# Patient Record
Sex: Female | Born: 1975 | Race: White | Hispanic: No | Marital: Married | Smoking: Former smoker
Health system: Southern US, Community
[De-identification: ages and names within clinical notes are randomized; demographics above are authoritative.]

## PROBLEM LIST (undated history)

## (undated) DIAGNOSIS — K5900 Constipation, unspecified: Secondary | ICD-10-CM

## (undated) DIAGNOSIS — E559 Vitamin D deficiency, unspecified: Secondary | ICD-10-CM

## (undated) DIAGNOSIS — F419 Anxiety disorder, unspecified: Secondary | ICD-10-CM

## (undated) DIAGNOSIS — R609 Edema, unspecified: Secondary | ICD-10-CM

## (undated) DIAGNOSIS — T8859XA Other complications of anesthesia, initial encounter: Secondary | ICD-10-CM

## (undated) DIAGNOSIS — N979 Female infertility, unspecified: Secondary | ICD-10-CM

## (undated) DIAGNOSIS — F909 Attention-deficit hyperactivity disorder, unspecified type: Secondary | ICD-10-CM

## (undated) DIAGNOSIS — Z973 Presence of spectacles and contact lenses: Secondary | ICD-10-CM

## (undated) DIAGNOSIS — E7800 Pure hypercholesterolemia, unspecified: Secondary | ICD-10-CM

## (undated) DIAGNOSIS — R42 Dizziness and giddiness: Secondary | ICD-10-CM

## (undated) DIAGNOSIS — I10 Essential (primary) hypertension: Secondary | ICD-10-CM

## (undated) DIAGNOSIS — D259 Leiomyoma of uterus, unspecified: Secondary | ICD-10-CM

## (undated) DIAGNOSIS — F32A Depression, unspecified: Secondary | ICD-10-CM

## (undated) DIAGNOSIS — Z9889 Other specified postprocedural states: Secondary | ICD-10-CM

## (undated) HISTORY — DX: Constipation, unspecified: K59.00

## (undated) HISTORY — DX: Edema, unspecified: R60.9

## (undated) HISTORY — DX: Essential (primary) hypertension: I10

## (undated) HISTORY — DX: Female infertility, unspecified: N97.9

## (undated) HISTORY — PX: DILATION AND CURETTAGE OF UTERUS: SHX78

## (undated) HISTORY — DX: Pure hypercholesterolemia, unspecified: E78.00

---

## 2010-11-09 HISTORY — PX: DILATION AND CURETTAGE OF UTERUS: SHX78

## 2012-09-01 ENCOUNTER — Other Ambulatory Visit (HOSPITAL_COMMUNITY): Payer: Self-pay | Admitting: Obstetrics & Gynecology

## 2012-09-01 DIAGNOSIS — N979 Female infertility, unspecified: Secondary | ICD-10-CM

## 2012-09-06 ENCOUNTER — Ambulatory Visit (HOSPITAL_COMMUNITY): Payer: Managed Care, Other (non HMO)

## 2014-10-01 ENCOUNTER — Other Ambulatory Visit: Payer: Self-pay | Admitting: Family Medicine

## 2014-10-01 DIAGNOSIS — R102 Pelvic and perineal pain: Secondary | ICD-10-CM

## 2014-11-14 ENCOUNTER — Other Ambulatory Visit: Payer: Self-pay | Admitting: Family Medicine

## 2014-11-14 DIAGNOSIS — R102 Pelvic and perineal pain: Secondary | ICD-10-CM

## 2016-01-06 ENCOUNTER — Other Ambulatory Visit (HOSPITAL_COMMUNITY): Payer: Self-pay | Admitting: Obstetrics & Gynecology

## 2016-01-06 DIAGNOSIS — N979 Female infertility, unspecified: Secondary | ICD-10-CM

## 2016-01-10 ENCOUNTER — Ambulatory Visit (HOSPITAL_COMMUNITY)
Admission: RE | Admit: 2016-01-10 | Discharge: 2016-01-10 | Disposition: A | Discharge status: Home / Self Care | Payer: Managed Care, Other (non HMO) | Source: Ambulatory Visit | Attending: Obstetrics & Gynecology | Admitting: Obstetrics & Gynecology

## 2016-01-10 DIAGNOSIS — N979 Female infertility, unspecified: Secondary | ICD-10-CM | POA: Insufficient documentation

## 2016-01-10 MED ORDER — IOHEXOL 300 MG/ML  SOLN
30.0000 mL | Freq: Once | INTRAMUSCULAR | Status: AC | PRN
  Administered 2016-01-10: 30 mL

## 2017-07-26 ENCOUNTER — Ambulatory Visit (INDEPENDENT_AMBULATORY_CARE_PROVIDER_SITE_OTHER): Payer: Managed Care, Other (non HMO) | Admitting: Obstetrics & Gynecology

## 2017-07-26 ENCOUNTER — Encounter: Payer: Self-pay | Admitting: Obstetrics & Gynecology

## 2017-07-26 VITALS — BP 130/80 | Ht 61.0 in | Wt 168.0 lb

## 2017-07-26 DIAGNOSIS — E6609 Other obesity due to excess calories: Secondary | ICD-10-CM

## 2017-07-26 DIAGNOSIS — Z01419 Encounter for gynecological examination (general) (routine) without abnormal findings: Secondary | ICD-10-CM | POA: Diagnosis not present

## 2017-07-26 DIAGNOSIS — N979 Female infertility, unspecified: Secondary | ICD-10-CM | POA: Diagnosis not present

## 2017-07-26 DIAGNOSIS — Z6831 Body mass index (BMI) 31.0-31.9, adult: Secondary | ICD-10-CM

## 2017-07-26 NOTE — Patient Instructions (Signed)
1. Well female exam with routine gynecological exam Normal gyn exam.  Normal pap/HPV HR neg 04/2016.  Will repeat pap next year.  Breasts wnl.  2. Primary female infertility Expectant management desires.  Declines Fertility treatment, but would welcome spontaneous conception.  3. Class 1 obesity due to excess calories without serious comorbidity with body mass index (BMI) of 31.0 to 31.9 in adult Management of weight loss with Dr Stephanie Acre.  Low Calorie/carb diet discussed, [employer] suggested.  Recommend including weight lifting to physical activity program.  Donna Wong, it was a pleasure to see you today!    Health Maintenance, Female Adopting a healthy lifestyle and getting preventive care can go a long way to promote health and wellness. Talk with your health care provider about what schedule of regular examinations is right for you. This is a good chance for you to check in with your provider about disease prevention and staying healthy. In between checkups, there are plenty of things you can do on your own. Experts have done a lot of research about which lifestyle changes and preventive measures are most likely to keep you healthy. Ask your health care provider for more information. Weight and diet Eat a healthy diet  Be sure to include plenty of vegetables, fruits, low-fat dairy products, and lean protein.  Do not eat a lot of foods high in solid fats, added sugars, or salt.  Get regular exercise. This is one of the most important things you can do for your health. ? Most adults should exercise for at least 150 minutes each week. The exercise should increase your heart rate and make you sweat (moderate-intensity exercise). ? Most adults should also do strengthening exercises at least twice a week. This is in addition to the moderate-intensity exercise.  Maintain a healthy weight  Body mass index (BMI) is a measurement that can be used to identify possible weight problems. It  estimates body fat based on height and weight. Your health care provider can help determine your BMI and help you achieve or maintain a healthy weight.  For females 54 years of age and older: ? A BMI below 18.5 is considered underweight. ? A BMI of 18.5 to 24.9 is normal. ? A BMI of 25 to 29.9 is considered overweight. ? A BMI of 30 and above is considered obese.  Watch levels of cholesterol and blood lipids  You should start having your blood tested for lipids and cholesterol at 39 years of age, then have this test every 5 years.  You may need to have your cholesterol levels checked more often if: ? Your lipid or cholesterol levels are high. ? You are older than 39 years of age. ? You are at high risk for heart disease.  Cancer screening Lung Cancer  Lung cancer screening is recommended for adults 33-32 years old who are at high risk for lung cancer because of a history of smoking.  A yearly low-dose CT scan of the lungs is recommended for people who: ? Currently smoke. ? Have quit within the past 15 years. ? Have at least a 30-pack-year history of smoking. A pack year is smoking an average of one pack of cigarettes a day for 1 year.  Yearly screening should continue until it has been 15 years since you quit.  Yearly screening should stop if you develop a health problem that would prevent you from having lung cancer treatment.  Breast Cancer  Practice breast self-awareness. This means understanding how your breasts  normally appear and feel.  It also means doing regular breast self-exams. Let your health care provider know about any changes, no matter how small.  If you are in your 20s or 30s, you should have a clinical breast exam (CBE) by a health care provider every 1-3 years as part of a regular health exam.  If you are 42 or older, have a CBE every year. Also consider having a breast X-ray (mammogram) every year.  If you have a family history of breast cancer, talk to  your health care provider about genetic screening.  If you are at high risk for breast cancer, talk to your health care provider about having an MRI and a mammogram every year.  Breast cancer gene (BRCA) assessment is recommended for women who have family members with BRCA-related cancers. BRCA-related cancers include: ? Breast. ? Ovarian. ? Tubal. ? Peritoneal cancers.  Results of the assessment will determine the need for genetic counseling and BRCA1 and BRCA2 testing.  Cervical Cancer Your health care provider may recommend that you be screened regularly for cancer of the pelvic organs (ovaries, uterus, and vagina). This screening involves a pelvic examination, including checking for microscopic changes to the surface of your cervix (Pap test). You may be encouraged to have this screening done every 3 years, beginning at age 66.  For women ages 13-65, health care providers may recommend pelvic exams and Pap testing every 3 years, or they may recommend the Pap and pelvic exam, combined with testing for human papilloma virus (HPV), every 5 years. Some types of HPV increase your risk of cervical cancer. Testing for HPV may also be done on women of any age with unclear Pap test results.  Other health care providers may not recommend any screening for nonpregnant women who are considered low risk for pelvic cancer and who do not have symptoms. Ask your health care provider if a screening pelvic exam is right for you.  If you have had past treatment for cervical cancer or a condition that could lead to cancer, you need Pap tests and screening for cancer for at least 20 years after your treatment. If Pap tests have been discontinued, your risk factors (such as having a new sexual partner) need to be reassessed to determine if screening should resume. Some women have medical problems that increase the chance of getting cervical cancer. In these cases, your health care provider may recommend more  frequent screening and Pap tests.  Colorectal Cancer  This type of cancer can be detected and often prevented.  Routine colorectal cancer screening usually begins at 39 years of age and continues through 39 years of age.  Your health care provider may recommend screening at an earlier age if you have risk factors for colon cancer.  Your health care provider may also recommend using home test kits to check for hidden blood in the stool.  A small camera at the end of a tube can be used to examine your colon directly (sigmoidoscopy or colonoscopy). This is done to check for the earliest forms of colorectal cancer.  Routine screening usually begins at age 34.  Direct examination of the colon should be repeated every 5-10 years through 39 years of age. However, you may need to be screened more often if early forms of precancerous polyps or small growths are found.  Skin Cancer  Check your skin from head to toe regularly.  Tell your health care provider about any new moles or changes in moles,  especially if there is a change in a mole's shape or color.  Also tell your health care provider if you have a mole that is larger than the size of a pencil eraser.  Always use sunscreen. Apply sunscreen liberally and repeatedly throughout the day.  Protect yourself by wearing long sleeves, pants, a wide-brimmed hat, and sunglasses whenever you are outside.  Heart disease, diabetes, and high blood pressure  High blood pressure causes heart disease and increases the risk of stroke. High blood pressure is more likely to develop in: ? People who have blood pressure in the high end of the normal range (130-139/85-89 mm Hg). ? People who are overweight or obese. ? People who are African American.  If you are 51-64 years of age, have your blood pressure checked every 3-5 years. If you are 38 years of age or older, have your blood pressure checked every year. You should have your blood pressure measured  twice-once when you are at a hospital or clinic, and once when you are not at a hospital or clinic. Record the average of the two measurements. To check your blood pressure when you are not at a hospital or clinic, you can use: ? An automated blood pressure machine at a pharmacy. ? A home blood pressure monitor.  If you are between 39 years and 13 years old, ask your health care provider if you should take aspirin to prevent strokes.  Have regular diabetes screenings. This involves taking a blood sample to check your fasting blood sugar level. ? If you are at a normal weight and have a low risk for diabetes, have this test once every three years after 39 years of age. ? If you are overweight and have a high risk for diabetes, consider being tested at a younger age or more often. Preventing infection Hepatitis B  If you have a higher risk for hepatitis B, you should be screened for this virus. You are considered at high risk for hepatitis B if: ? You were born in a country where hepatitis B is common. Ask your health care provider which countries are considered high risk. ? Your parents were born in a high-risk country, and you have not been immunized against hepatitis B (hepatitis B vaccine). ? You have HIV or AIDS. ? You use needles to inject street drugs. ? You live with someone who has hepatitis B. ? You have had sex with someone who has hepatitis B. ? You get hemodialysis treatment. ? You take certain medicines for conditions, including cancer, organ transplantation, and autoimmune conditions.  Hepatitis C  Blood testing is recommended for: ? Everyone born from [birth year] through 1963. ? Anyone with known risk factors for hepatitis C.  Sexually transmitted infections (STIs)  You should be screened for sexually transmitted infections (STIs) including gonorrhea and chlamydia if: ? You are sexually active and are younger than 39 years of age. ? You are older than 39 years of age and your  health care provider tells you that you are at risk for this type of infection. ? Your sexual activity has changed since you were last screened and you are at an increased risk for chlamydia or gonorrhea. Ask your health care provider if you are at risk.  If you do not have HIV, but are at risk, it may be recommended that you take a prescription medicine daily to prevent HIV infection. This is called pre-exposure prophylaxis (PrEP). You are considered at risk if: ? You are sexually  active and do not regularly use condoms or know the HIV status of your partner(s). ? You take drugs by injection. ? You are sexually active with a partner who has HIV.  Talk with your health care provider about whether you are at high risk of being infected with HIV. If you choose to begin PrEP, you should first be tested for HIV. You should then be tested every 3 months for as long as you are taking PrEP. Pregnancy  If you are premenopausal and you may become pregnant, ask your health care provider about preconception counseling.  If you may become pregnant, take 400 to 800 micrograms (mcg) of folic acid every day.  If you want to prevent pregnancy, talk to your health care provider about birth control (contraception). Osteoporosis and menopause  Osteoporosis is a disease in which the bones lose minerals and strength with aging. This can result in serious bone fractures. Your risk for osteoporosis can be identified using a bone density scan.  If you are 48 years of age or older, or if you are at risk for osteoporosis and fractures, ask your health care provider if you should be screened.  Ask your health care provider whether you should take a calcium or vitamin D supplement to lower your risk for osteoporosis.  Menopause may have certain physical symptoms and risks.  Hormone replacement therapy may reduce some of these symptoms and risks. Talk to your health care provider about whether hormone replacement  therapy is right for you. Follow these instructions at home:  Schedule regular health, dental, and eye exams.  Stay current with your immunizations.  Do not use any tobacco products including cigarettes, chewing tobacco, or electronic cigarettes.  If you are pregnant, do not drink alcohol.  If you are breastfeeding, limit how much and how often you drink alcohol.  Limit alcohol intake to no more than 1 drink per day for nonpregnant women. One drink equals 12 ounces of beer, 5 ounces of wine, or 1 ounces of hard liquor.  Do not use street drugs.  Do not share needles.  Ask your health care provider for help if you need support or information about quitting drugs.  Tell your health care provider if you often feel depressed.  Tell your health care provider if you have ever been abused or do not feel safe at home. This information is not intended to replace advice given to you by your health care provider. Make sure you discuss any questions you have with your health care provider. Document Released: 05/11/2011 Document Revised: 04/02/2016 Document Reviewed: 07/30/2015 Elsevier Interactive Patient Education  [employer].

## 2017-07-26 NOTE — Progress Notes (Signed)
Donna Wong February 23, 1976 [id number]   History:    41 y.o. G1P0A1  Married.  Infertility x >7 yrs  RP:  Established patient presenting for annual gyn exam   HPI:  Menses reg normal every month.  Long standing primary infertility.  Declines Fertility treatment and contraception.  No pelvic pain.  Normal vaginal secretions.  Breasts wnl.  Mictions/BMs wnl.  BMI 31.74.  Seen by Dr Georgana Curio MD this am, weight loss management discussed.  Labs at work.  Past medical history,surgical history, family history and social history were all reviewed and documented in the EPIC chart.  Gynecologic History Patient's last menstrual period was 07/09/2017. Contraception: none Last Pap: 04/2016. Results were: Neg/HPV HR neg Last mammogram: Never.   Obstetric History OB History  No data available     ROS: A ROS was performed and pertinent positives and negatives are included in the history.  GENERAL: No fevers or chills. HEENT: No change in vision, no earache, sore throat or sinus congestion. NECK: No pain or stiffness. CARDIOVASCULAR: No chest pain or pressure. No palpitations. PULMONARY: No shortness of breath, cough or wheeze. GASTROINTESTINAL: No abdominal pain, nausea, vomiting or diarrhea, melena or bright red blood per rectum. GENITOURINARY: No urinary frequency, urgency, hesitancy or dysuria. MUSCULOSKELETAL: No joint or muscle pain, no back pain, no recent trauma. DERMATOLOGIC: No rash, no itching, no lesions. ENDOCRINE: No polyuria, polydipsia, no heat or cold intolerance. No recent change in weight. HEMATOLOGICAL: No anemia or easy bruising or bleeding. NEUROLOGIC: No headache, seizures, numbness, tingling or weakness. PSYCHIATRIC: No depression, no loss of interest in normal activity or change in sleep pattern.     Exam:   Ht  (1.549 m)   Wt 168 lb (76.2 kg)   LMP 07/09/2017 Comment: NO BIRTH CONTROL   BMI 31.74 kg/m   Body mass index is 31.74 kg/m.  General appearance : Well  developed well nourished female. No acute distress HEENT: Eyes: no retinal hemorrhage or exudates,  Neck supple, trachea midline, no carotid bruits, no thyroidmegaly Lungs: Clear to auscultation, no rhonchi or wheezes, or rib retractions  Heart: Regular rate and rhythm, no murmurs or gallops Breast:Examined in sitting and supine position were symmetrical in appearance, no palpable masses or tenderness,  no skin retraction, no nipple inversion, no nipple discharge, no skin discoloration, no axillary or supraclavicular lymphadenopathy Abdomen: no palpable masses or tenderness, no rebound or guarding Extremities: no edema or skin discoloration or tenderness  Pelvic: Vulva normal  Bartholin, Urethra, Skene Glands: Within normal limits             Vagina: No gross lesions or discharge  Cervix: No gross lesions or discharge  Uterus  AV, normal size, shape and consistency, non-tender and mobile  Adnexa  Without masses or tenderness  Anus and perineum  normal     Assessment/Plan:  41 y.o. female for annual exam   1. Well female exam with routine gynecological exam Normal gyn exam.  Normal pap/HPV HR neg 04/2016.  Will repeat pap next year.  Breasts wnl.  2. Primary female infertility Expectant management desires.  Declines Fertility treatment, but would welcome spontaneous conception.  3. Class 1 obesity due to excess calories without serious comorbidity with body mass index (BMI) of 31.0 to 31.9 in adult Management of weight loss with Dr Paulino Rily.  Low Calorie/carb diet discussed, [employer] suggested.  Recommend including weight lifting to physical activity program.  Genia Del MD, 3:55 PM 07/26/2017

## 2018-01-14 ENCOUNTER — Ambulatory Visit: Payer: 59 | Admitting: Psychology

## 2018-01-14 DIAGNOSIS — F331 Major depressive disorder, recurrent, moderate: Secondary | ICD-10-CM | POA: Diagnosis not present

## 2018-01-20 ENCOUNTER — Ambulatory Visit: Payer: 59 | Admitting: Psychology

## 2018-01-20 DIAGNOSIS — F331 Major depressive disorder, recurrent, moderate: Secondary | ICD-10-CM

## 2018-01-26 ENCOUNTER — Ambulatory Visit: Payer: 59 | Admitting: Psychology

## 2018-01-26 DIAGNOSIS — F331 Major depressive disorder, recurrent, moderate: Secondary | ICD-10-CM | POA: Diagnosis not present

## 2018-01-31 ENCOUNTER — Ambulatory Visit: Payer: 59 | Admitting: Psychology

## 2018-01-31 DIAGNOSIS — F331 Major depressive disorder, recurrent, moderate: Secondary | ICD-10-CM | POA: Diagnosis not present

## 2018-02-14 ENCOUNTER — Ambulatory Visit: Payer: 59 | Admitting: Psychology

## 2018-02-21 ENCOUNTER — Ambulatory Visit: Payer: 59 | Admitting: Psychology

## 2018-02-21 DIAGNOSIS — F331 Major depressive disorder, recurrent, moderate: Secondary | ICD-10-CM | POA: Diagnosis not present

## 2018-02-28 ENCOUNTER — Ambulatory Visit: Payer: Self-pay | Admitting: Psychology

## 2018-03-07 ENCOUNTER — Ambulatory Visit: Payer: 59 | Admitting: Psychology

## 2018-03-07 DIAGNOSIS — F331 Major depressive disorder, recurrent, moderate: Secondary | ICD-10-CM

## 2018-03-14 ENCOUNTER — Ambulatory Visit: Payer: 59 | Admitting: Psychology

## 2018-03-21 ENCOUNTER — Ambulatory Visit: Payer: 59 | Admitting: Psychology

## 2018-03-21 DIAGNOSIS — F331 Major depressive disorder, recurrent, moderate: Secondary | ICD-10-CM

## 2018-03-28 ENCOUNTER — Ambulatory Visit: Payer: 59 | Admitting: Psychology

## 2018-03-28 DIAGNOSIS — F331 Major depressive disorder, recurrent, moderate: Secondary | ICD-10-CM | POA: Diagnosis not present

## 2018-04-11 ENCOUNTER — Ambulatory Visit: Payer: Self-pay | Admitting: Psychology

## 2018-04-25 ENCOUNTER — Ambulatory Visit: Payer: 59 | Admitting: Psychology

## 2018-04-25 DIAGNOSIS — F331 Major depressive disorder, recurrent, moderate: Secondary | ICD-10-CM

## 2018-05-02 ENCOUNTER — Ambulatory Visit: Payer: 59 | Admitting: Psychology

## 2018-05-09 ENCOUNTER — Ambulatory Visit: Payer: 59 | Admitting: Psychology

## 2018-05-09 DIAGNOSIS — F331 Major depressive disorder, recurrent, moderate: Secondary | ICD-10-CM

## 2018-05-23 ENCOUNTER — Ambulatory Visit: Payer: 59 | Admitting: Psychology

## 2018-05-30 ENCOUNTER — Ambulatory Visit: Payer: 59 | Admitting: Psychology

## 2018-06-06 ENCOUNTER — Ambulatory Visit (INDEPENDENT_AMBULATORY_CARE_PROVIDER_SITE_OTHER): Payer: 59 | Admitting: Psychology

## 2018-06-06 DIAGNOSIS — F331 Major depressive disorder, recurrent, moderate: Secondary | ICD-10-CM

## 2018-06-13 ENCOUNTER — Ambulatory Visit: Payer: 59 | Admitting: Psychology

## 2018-06-20 ENCOUNTER — Ambulatory Visit: Payer: 59 | Admitting: Psychology

## 2018-06-27 ENCOUNTER — Ambulatory Visit: Payer: 59 | Admitting: Psychology

## 2018-06-27 DIAGNOSIS — F331 Major depressive disorder, recurrent, moderate: Secondary | ICD-10-CM

## 2018-07-04 ENCOUNTER — Ambulatory Visit: Payer: 59 | Admitting: Psychology

## 2018-07-27 ENCOUNTER — Encounter: Payer: Managed Care, Other (non HMO) | Admitting: Obstetrics & Gynecology

## 2018-10-25 ENCOUNTER — Encounter: Payer: Managed Care, Other (non HMO) | Admitting: Obstetrics & Gynecology

## 2018-11-07 ENCOUNTER — Encounter: Payer: Self-pay | Admitting: Obstetrics & Gynecology

## 2018-11-07 ENCOUNTER — Other Ambulatory Visit: Payer: Self-pay | Admitting: Obstetrics & Gynecology

## 2018-11-07 ENCOUNTER — Ambulatory Visit: Payer: Managed Care, Other (non HMO) | Admitting: Obstetrics & Gynecology

## 2018-11-07 VITALS — BP 126/82 | Ht 61.5 in | Wt 172.0 lb

## 2018-11-07 DIAGNOSIS — Z3009 Encounter for other general counseling and advice on contraception: Secondary | ICD-10-CM

## 2018-11-07 DIAGNOSIS — E6609 Other obesity due to excess calories: Secondary | ICD-10-CM

## 2018-11-07 DIAGNOSIS — Z6831 Body mass index (BMI) 31.0-31.9, adult: Secondary | ICD-10-CM | POA: Diagnosis not present

## 2018-11-07 DIAGNOSIS — Z1231 Encounter for screening mammogram for malignant neoplasm of breast: Secondary | ICD-10-CM

## 2018-11-07 DIAGNOSIS — R87612 Low grade squamous intraepithelial lesion on cytologic smear of cervix (LGSIL): Secondary | ICD-10-CM

## 2018-11-07 DIAGNOSIS — Z01419 Encounter for gynecological examination (general) (routine) without abnormal findings: Secondary | ICD-10-CM | POA: Diagnosis not present

## 2018-11-07 HISTORY — DX: Low grade squamous intraepithelial lesion on cytologic smear of cervix (LGSIL): R87.612

## 2018-11-07 NOTE — Patient Instructions (Signed)
1. Encounter for routine gynecological examination with Papanicolaou smear of cervix Normal gynecologic exam.  Pap reflex done.  Breast exam normal.  Will schedule first screening mammogram at the breast center.  Health labs with family physician Dr. Paulino RilyWolters  2. Encounter for other general counseling or advice on contraception Declines contraception.  3. Class 1 obesity due to excess calories without serious comorbidity with body mass index (BMI) of 31.0 to 31.9 in adult Decreased caloric intake and intermittent fasting discussed with patient.  Counseling done.  Planning to increase fitness activities.  Recommend aerobic activities 5 times a week and weightlifting every 2 days.  Other orders - amoxicillin (AMOXIL) 875 MG tablet; Take 875 mg by mouth 2 (two) times daily.  Donna Wong, it was a pleasure seeing you today!  I will inform you of your results as soon as they are available.

## 2018-11-07 NOTE — Progress Notes (Signed)
Donna Wong 03/14/1976 [id number]   History:    42 y.o. 641P0A1 Married  RP:  Established patient presenting for annual gyn exam   HPI: Menstrual periods every month with normal flow.  No breakthrough bleeding.  No pelvic pain.  No pain with intercourse.  Urine and bowel movements normal.  Body mass index 31.97.  Gained 4 pounds since last year.  Planning to restart fitness activities and decrease calories.  Health labs with Fam MD Dr. Paulino RilyWolters.  Past medical history,surgical history, family history and social history were all reviewed and documented in the EPIC chart.  Gynecologic History Patient's last menstrual period was 11/02/2018. Contraception: Declined, h/o primary infertility Last Pap: 2017. Results were: Normal Last mammogram: Will schedule at the [facility] Bone Density: Never Colonoscopy: Never  Obstetric History OB History  Gravida Para Term Preterm AB Living  1 0     1 0  SAB TAB Ectopic Multiple Live Births  1            # Outcome Date GA Lbr Len/2nd Weight Sex Delivery Anes PTL Lv  1 SAB              ROS: A ROS was performed and pertinent positives and negatives are included in the history.  GENERAL: No fevers or chills. HEENT: No change in vision, no earache, sore throat or sinus congestion. NECK: No pain or stiffness. CARDIOVASCULAR: No chest pain or pressure. No palpitations. PULMONARY: No shortness of breath, cough or wheeze. GASTROINTESTINAL: No abdominal pain, nausea, vomiting or diarrhea, melena or bright red blood per rectum. GENITOURINARY: No urinary frequency, urgency, hesitancy or dysuria. MUSCULOSKELETAL: No joint or muscle pain, no back pain, no recent trauma. DERMATOLOGIC: No rash, no itching, no lesions. ENDOCRINE: No polyuria, polydipsia, no heat or cold intolerance. No recent change in weight. HEMATOLOGICAL: No anemia or easy bruising or bleeding. NEUROLOGIC: No headache, seizures, numbness, tingling or weakness. PSYCHIATRIC: No depression, no  loss of interest in normal activity or change in sleep pattern.     Exam:   BP 126/82   Ht 5' 1.5" (1.562 m)   Wt 172 lb (78 kg)   LMP 11/02/2018 Comment: no birth control   BMI 31.97 kg/m   Body mass index is 31.97 kg/m.  General appearance : Well developed well nourished female. No acute distress HEENT: Eyes: no retinal hemorrhage or exudates,  Neck supple, trachea midline, no carotid bruits, no thyroidmegaly Lungs: Clear to auscultation, no rhonchi or wheezes, or rib retractions  Heart: Regular rate and rhythm, no murmurs or gallops Breast:Examined in sitting and supine position were symmetrical in appearance, no palpable masses or tenderness,  no skin retraction, no nipple inversion, no nipple discharge, no skin discoloration, no axillary or supraclavicular lymphadenopathy Abdomen: no palpable masses or tenderness, no rebound or guarding Extremities: no edema or skin discoloration or tenderness  Pelvic: Vulva: Normal             Vagina: No gross lesions or discharge  Cervix: No gross lesions or discharge.  Pap reflex done  Uterus  AV, normal size, shape and consistency, non-tender and mobile  Adnexa  Without masses or tenderness  Anus: Normal   Assessment/Plan:  42 y.o. female for annual exam   1. Encounter for routine gynecological examination with Papanicolaou smear of cervix Normal gynecologic exam.  Pap reflex done.  Breast exam normal.  Will schedule first screening mammogram at the breast center.  Health labs with family physician Dr. Paulino RilyWolters  2. Encounter for other general counseling or advice on contraception Declines contraception.  3. Class 1 obesity due to excess calories without serious comorbidity with body mass index (BMI) of 31.0 to 31.9 in adult Decreased caloric intake and intermittent fasting discussed with patient.  Counseling done.  Planning to increase fitness activities.  Recommend aerobic activities 5 times a week and weightlifting every 2  days.  Other orders - amoxicillin (AMOXIL) 875 MG tablet; Take 875 mg by mouth 2 (two) times daily.  Genia DelMarie-Lyne Airianna Kreischer MD, 8:09 AM 11/07/2018

## 2018-11-07 NOTE — Addendum Note (Signed)
Addended by: Berna SpareASTILLO, BLANCA A on: 11/07/2018 09:17 AM   Modules accepted: Orders

## 2018-11-10 ENCOUNTER — Ambulatory Visit
Admission: RE | Admit: 2018-11-10 | Discharge: 2018-11-10 | Disposition: A | Discharge status: Home / Self Care | Payer: 59 | Source: Ambulatory Visit | Attending: Obstetrics & Gynecology | Admitting: Obstetrics & Gynecology

## 2018-11-10 DIAGNOSIS — Z1231 Encounter for screening mammogram for malignant neoplasm of breast: Secondary | ICD-10-CM

## 2018-11-10 LAB — PAP IG W/ RFLX HPV ASCU

## 2018-12-19 ENCOUNTER — Ambulatory Visit: Payer: 59 | Admitting: Psychology

## 2018-12-20 ENCOUNTER — Ambulatory Visit: Payer: Managed Care, Other (non HMO) | Admitting: Obstetrics & Gynecology

## 2019-01-02 ENCOUNTER — Ambulatory Visit: Payer: 59 | Admitting: Psychology

## 2019-01-09 ENCOUNTER — Ambulatory Visit: Payer: 59 | Admitting: Psychology

## 2019-01-18 ENCOUNTER — Encounter (INDEPENDENT_AMBULATORY_CARE_PROVIDER_SITE_OTHER): Payer: Managed Care, Other (non HMO)

## 2019-01-18 ENCOUNTER — Other Ambulatory Visit: Payer: Self-pay

## 2019-01-23 ENCOUNTER — Encounter (INDEPENDENT_AMBULATORY_CARE_PROVIDER_SITE_OTHER): Payer: Self-pay | Admitting: Family Medicine

## 2019-01-23 ENCOUNTER — Ambulatory Visit (INDEPENDENT_AMBULATORY_CARE_PROVIDER_SITE_OTHER): Payer: Managed Care, Other (non HMO) | Admitting: Family Medicine

## 2019-01-23 ENCOUNTER — Other Ambulatory Visit: Payer: Self-pay

## 2019-01-23 VITALS — BP 114/78 | HR 66 | Temp 98.0°F | Ht 62.0 in | Wt 169.0 lb

## 2019-01-23 DIAGNOSIS — Z9189 Other specified personal risk factors, not elsewhere classified: Secondary | ICD-10-CM

## 2019-01-23 DIAGNOSIS — Z0289 Encounter for other administrative examinations: Secondary | ICD-10-CM

## 2019-01-23 DIAGNOSIS — Z6830 Body mass index (BMI) 30.0-30.9, adult: Secondary | ICD-10-CM

## 2019-01-23 DIAGNOSIS — R5383 Other fatigue: Secondary | ICD-10-CM

## 2019-01-23 DIAGNOSIS — E669 Obesity, unspecified: Secondary | ICD-10-CM

## 2019-01-23 DIAGNOSIS — E7849 Other hyperlipidemia: Secondary | ICD-10-CM | POA: Diagnosis not present

## 2019-01-23 DIAGNOSIS — R0602 Shortness of breath: Secondary | ICD-10-CM | POA: Diagnosis not present

## 2019-01-23 DIAGNOSIS — K5909 Other constipation: Secondary | ICD-10-CM | POA: Diagnosis not present

## 2019-01-23 DIAGNOSIS — Z1331 Encounter for screening for depression: Secondary | ICD-10-CM

## 2019-01-23 NOTE — Progress Notes (Signed)
.  Office: [phone]  /  Fax: [phone]   HPI:   Chief Complaint: OBESITY  Donna Wong (MR# [id number]) is a 43 y.o. female who presents on 01/23/2019 for obesity evaluation and treatment. Current BMI is Body mass index is 30.91 kg/m.Marland Kitchen Quatesha has struggled with obesity for years and has been unsuccessful in either losing weight or maintaining long term weight loss. Ethelee attended our information session and states she is currently in the action stage of change and ready to dedicate time achieving and maintaining a healthier weight.   Ketty heard about our clinic from her co-worker.  Nashara states her family eats meals together she struggles with family and or coworkers weight loss sabotage her desired weight loss is 49 lbs she has been heavy most of  her life she started gaining weight about age 57 her heaviest weight ever was 169 lbs she skips meals frequently she is frequently drinking liquids with calories she frequently makes poor food choices she has problems with excessive hunger  she frequently eats larger portions than normal  she struggles with emotional eating    Fatigue Estefany feels her energy is lower than it should be. This has worsened with weight gain and has not worsened recently. Jacelle admits to daytime somnolence and  admits to waking up still tired. Patient is at risk for obstructive sleep apnea. Patent has a history of symptoms of daytime fatigue and morning headache. Patient generally gets 7 hours of sleep per night, and states they generally have nightime awakenings. Snoring is present. Apneic episodes are not present. Epworth Sleepiness Score is 8.  Dyspnea on exertion Kaisha notes increasing shortness of breath with exercising and seems to be worsening over time with weight gain. She notes getting out of breath sooner with activity than she used to. This has not gotten worse recently. EKG-normal sinus rhythm, questionable short PR interval. Revia  denies orthopnea.  Constipation Mariele notes constipation. She is not taking any OTC medications and she denies symptoms currently.   Hyperlipidemia Cheznie has hyperlipidemia and has been trying to improve her cholesterol levels with intensive lifestyle modification including a low saturated fat diet, exercise and weight loss. She reports elevated LDL for many years. She is not on medications and denies any chest pain, claudication or myalgias.  At risk for cardiovascular disease Niharika is at a higher than average risk for cardiovascular disease due to obesity and hyperlipidemia. She currently denies any chest pain.  Depression Screen Kelda's Food and Mood (modified PHQ-9) score was  Depression screen PHQ 2/9 01/23/2019  Decreased Interest 3  Down, Depressed, Hopeless 3  PHQ - 2 Score 6  Altered sleeping 3  Tired, decreased energy 3  Change in appetite 3  Feeling bad or failure about yourself  3  Trouble concentrating 1  Moving slowly or fidgety/restless 0  Suicidal thoughts 0  PHQ-9 Score 19  Difficult doing work/chores Somewhat difficult    ASSESSMENT AND PLAN:  Other fatigue - Plan: EKG 12-Lead, Vitamin B12, CBC With Differential, Comprehensive metabolic panel, Folate, Hemoglobin A1c, Insulin, random, T3, T4, free, TSH, VITAMIN D 25 Hydroxy (Vit-D Deficiency, Fractures)  Shortness of breath on exertion  Other constipation  Other hyperlipidemia - Plan: Lipid Panel With LDL/HDL Ratio  Depression screening  At risk for heart disease  Class 1 obesity with serious comorbidity and body mass index (BMI) of 30.0 to 30.9 in adult, unspecified obesity type  PLAN:  Fatigue Abbott was informed that her fatigue may be related to  obesity, depression or many other causes. Labs will be ordered, and in the meanwhile Gaylene has agreed to work on diet, exercise and weight loss to help with fatigue. Proper sleep hygiene was discussed including the need for 7-8 hours of quality sleep  each night. A sleep study was not ordered based on symptoms and Epworth score.  Dyspnea on exertion Eyvette's shortness of breath appears to be obesity related and exercise induced. She has agreed to work on weight loss and gradually increase exercise to treat her exercise induced shortness of breath. If Basil follows our instructions and loses weight without improvement of her shortness of breath, we will plan to refer to pulmonology. We will monitor this condition regularly. Tuyet agrees to this plan.  Constipation Milanie was informed decrease bowel movement frequency is normal while losing weight, but stools should not be hard or painful. She was advised to increase her H20 intake and work on increasing her fiber intake. High fiber foods were discussed today. We will follow up at next appointment.  Hyperlipidemia Kenlee was informed of the American Heart Association Guidelines emphasizing intensive lifestyle modifications as the first line treatment for hyperlipidemia. We discussed many lifestyle modifications today in depth, and Bayli will continue to work on decreasing saturated fats such as fatty red meat, butter and many fried foods. She will also increase vegetables and lean protein in her diet and continue to work on exercise and weight loss efforts. We will check FLP today. Latina agrees to follow up with our clinic in 2 weeks.  Cardiovascular risk counseling Andreea was given extended (15 minutes) coronary artery disease prevention counseling today. She is 43 y.o. female and has risk factors for heart disease including obesity and hyperlipidemia. We discussed intensive lifestyle modifications today with an emphasis on specific weight loss instructions and strategies. Pt was also informed of the importance of increasing exercise and decreasing saturated fats to help prevent heart disease.  Depression Screen Jhene had a strongly positive depression screening. Depression is commonly  associated with obesity and often results in emotional eating behaviors. We will monitor this closely and work on CBT to help improve the non-hunger eating patterns. Referral to Psychology may be required if no improvement is seen as she continues in our clinic.  Obesity Kyesha is currently in the action stage of change and her goal is to continue with weight loss efforts She has agreed to follow the Category 3 plan Maricsa has been instructed to work up to a goal of 150 minutes of combined cardio and strengthening exercise per week for weight loss and overall health benefits. We discussed the following Behavioral Modification Strategies today: increasing lean protein intake, increasing vegetables and work on meal planning and easy cooking plans, and planning for success  Sayana has agreed to follow up with our clinic in 2 weeks. She was informed of the importance of frequent follow up visits to maximize her success with intensive lifestyle modifications for her multiple health conditions. She was informed we would discuss her lab results at her next visit unless there is a critical issue that needs to be addressed sooner. Teniola agreed to keep her next visit at the agreed upon time to discuss these results.  ALLERGIES: No Known Allergies  MEDICATIONS: Current Outpatient Medications on File Prior to Visit  Medication Sig Dispense Refill  . Biotin 1000 MCG CHEW Chew 1 capsule by mouth daily.    Marland Kitchen ibuprofen (ADVIL,MOTRIN) 200 MG tablet Take 200 mg by mouth every 6 (six)  hours as needed.    . Multiple Vitamin (MULTIVITAMIN) tablet Take 1 tablet by mouth daily.     No current facility-administered medications on file prior to visit.     PAST MEDICAL HISTORY: Past Medical History:  Diagnosis Date  . Constipation   . High blood pressure   . High cholesterol   . Infertility, female   . Swelling     PAST SURGICAL HISTORY: Past Surgical History:  Procedure Laterality Date  . DILATION AND  CURETTAGE OF UTERUS      SOCIAL HISTORY: Social History   Tobacco Use  . Smoking status: Former Smoker    Last attempt to quit: 07/26/2010    Years since quitting: 8.5  . Smokeless tobacco: Never Used  Substance Use Topics  . Alcohol use: Yes    Alcohol/week: 2.0 standard drinks    Types: 2 Cans of beer per week    Comment: 2 drinks a week   . Drug use: No    FAMILY HISTORY: Family History  Problem Relation Age of Onset  . Heart attack Father   . Heart disease Father     ROS: Review of Systems  Constitutional: Positive for malaise/fatigue. Negative for weight loss.       + Trouble sleeping  Eyes:       + Wear glasses or contacts  Respiratory: Positive for shortness of breath (with exertion).   Cardiovascular: Negative for chest pain, orthopnea and claudication.  Gastrointestinal: Positive for constipation.  Musculoskeletal: Negative for myalgias.  Skin:       + Dryness  Neurological: Positive for weakness and headaches.    PHYSICAL EXAM: Blood pressure 114/78, pulse 66, temperature 98 F (36.7 C), temperature source Oral, height  (1.575 m), weight 169 lb (76.7 kg), last menstrual period 01/11/2019, SpO2 99 %. Body mass index is 30.91 kg/m. Physical Exam Vitals signs reviewed.  Constitutional:      Appearance: Normal appearance. She is obese.  HENT:     Head: Normocephalic and atraumatic.     Nose: Nose normal.  Eyes:     General: No scleral icterus.    Extraocular Movements: Extraocular movements intact.  Neck:     Musculoskeletal: Normal range of motion and neck supple.     Comments: No thyromegaly present Cardiovascular:     Rate and Rhythm: Normal rate and regular rhythm.     Pulses: Normal pulses.     Heart sounds: Normal heart sounds.  Pulmonary:     Effort: Pulmonary effort is normal. No respiratory distress.     Breath sounds: Normal breath sounds.  Abdominal:     Palpations: Abdomen is soft.     Tenderness: There is no abdominal  tenderness.     Comments: + Obesity  Musculoskeletal: Normal range of motion.     Right lower leg: No edema.     Left lower leg: No edema.  Skin:    General: Skin is warm and dry.  Neurological:     Mental Status: She is alert and oriented to person, place, and time.     Coordination: Coordination normal.  Psychiatric:        Mood and Affect: Mood normal.        Behavior: Behavior normal.     RECENT LABS AND TESTS: BMET No results found for: NA, K, CL, CO2, GLUCOSE, BUN, CREATININE, CALCIUM, GFRNONAA, GFRAA No results found for: HGBA1C No results found for: INSULIN CBC No results found for: WBC, RBC, HGB, HCT, PLT,  MCV, MCH, MCHC, RDW, LYMPHSABS, MONOABS, EOSABS, BASOSABS Iron/TIBC/Ferritin/ %Sat No results found for: IRON, TIBC, FERRITIN, IRONPCTSAT Lipid Panel  No results found for: CHOL, TRIG, HDL, CHOLHDL, VLDL, LDLCALC, LDLDIRECT Hepatic Function Panel  No results found for: PROT, ALBUMIN, AST, ALT, ALKPHOS, BILITOT, BILIDIR, IBILI No results found for: TSH Vitamin D No recent labs  ECG  shows NSR with a rate of 68 BPM INDIRECT CALORIMETER done today shows a VO2 of 247 and a REE of 1716. Her calculated basal metabolic rate is 2620 thus her basal metabolic rate is better than expected.       OBESITY BEHAVIORAL INTERVENTION VISIT  Today's visit was # 1   Starting weight: 169 lbs Starting date: 01/23/2019 Today's weight :169 lbs Today's date: 01/23/2019 Total lbs lost to date: 0    01/23/2019  Height 5\' 2"  (1.575 m)  Weight 169 lb (76.7 kg)  BMI (Calculated) 30.9  BLOOD PRESSURE - SYSTOLIC 114  BLOOD PRESSURE - DIASTOLIC 78  Waist Measurement  37.5 inches   Body Fat % 36.2 %  Total Body Water (lbs) 73.4 lbs  RMR 1716     ASK: We discussed the diagnosis of obesity with Reynolds Bowl today and Jacklynn agreed to give [place] permission to discuss obesity behavioral modification therapy today.  ASSESS: Lovell has the diagnosis of obesity and her BMI today is  30.9 Latasia is in the action stage of change   ADVISE: Jenny was educated on the multiple health risks of obesity as well as the benefit of weight loss to improve her health. She was advised of the need for long term treatment and the importance of lifestyle modifications to improve her current health and to decrease her risk of future health problems.  AGREE: Multiple dietary modification options and treatment options were discussed and  Zilah agreed to follow the recommendations documented in the above note.  ARRANGE: Sorrel was educated on the importance of frequent visits to treat obesity as outlined per CMS and USPSTF guidelines and agreed to schedule her next follow up appointment today.   I, Burt Knack, am acting as transcriptionist for Debbra Riding, MD    I have reviewed the above documentation for accuracy and completeness, and I agree with the above. - Debbra Riding, MD

## 2019-01-24 ENCOUNTER — Ambulatory Visit: Payer: Managed Care, Other (non HMO) | Admitting: Obstetrics & Gynecology

## 2019-01-24 ENCOUNTER — Encounter: Payer: Self-pay | Admitting: Obstetrics & Gynecology

## 2019-01-24 ENCOUNTER — Encounter (INDEPENDENT_AMBULATORY_CARE_PROVIDER_SITE_OTHER): Payer: Self-pay | Admitting: Family Medicine

## 2019-01-24 ENCOUNTER — Other Ambulatory Visit: Payer: Self-pay

## 2019-01-24 VITALS — BP 128/86

## 2019-01-24 DIAGNOSIS — Z113 Encounter for screening for infections with a predominantly sexual mode of transmission: Secondary | ICD-10-CM | POA: Diagnosis not present

## 2019-01-24 DIAGNOSIS — N870 Mild cervical dysplasia: Secondary | ICD-10-CM | POA: Diagnosis not present

## 2019-01-24 DIAGNOSIS — R87612 Low grade squamous intraepithelial lesion on cytologic smear of cervix (LGSIL): Secondary | ICD-10-CM

## 2019-01-24 LAB — CBC WITH DIFFERENTIAL
BASOS: 1 %
Basophils Absolute: 0.1 10*3/uL (ref 0.0–0.2)
EOS (ABSOLUTE): 0.1 10*3/uL (ref 0.0–0.4)
Eos: 1 %
Hematocrit: 40 % (ref 34.0–46.6)
Hemoglobin: 13.9 g/dL (ref 11.1–15.9)
Immature Grans (Abs): 0 10*3/uL (ref 0.0–0.1)
Immature Granulocytes: 0 %
Lymphocytes Absolute: 2 10*3/uL (ref 0.7–3.1)
Lymphs: 18 %
MCH: 30.6 pg (ref 26.6–33.0)
MCHC: 34.8 g/dL (ref 31.5–35.7)
MCV: 88 fL (ref 79–97)
Monocytes Absolute: 0.7 10*3/uL (ref 0.1–0.9)
Monocytes: 6 %
Neutrophils Absolute: 8.5 10*3/uL — ABNORMAL HIGH (ref 1.4–7.0)
Neutrophils: 74 %
RBC: 4.54 x10E6/uL (ref 3.77–5.28)
RDW: 13 % (ref 11.7–15.4)
WBC: 11.4 10*3/uL — ABNORMAL HIGH (ref 3.4–10.8)

## 2019-01-24 LAB — COMPREHENSIVE METABOLIC PANEL
ALT: 16 IU/L (ref 0–32)
AST: 15 IU/L (ref 0–40)
Albumin/Globulin Ratio: 1.8 (ref 1.2–2.2)
Albumin: 4.6 g/dL (ref 3.8–4.8)
Alkaline Phosphatase: 72 IU/L (ref 39–117)
BUN/Creatinine Ratio: 13 (ref 9–23)
BUN: 10 mg/dL (ref 6–24)
Bilirubin Total: 0.3 mg/dL (ref 0.0–1.2)
CO2: 22 mmol/L (ref 20–29)
Calcium: 9.8 mg/dL (ref 8.7–10.2)
Chloride: 99 mmol/L (ref 96–106)
Creatinine, Ser: 0.8 mg/dL (ref 0.57–1.00)
GFR calc Af Amer: 107 mL/min/{1.73_m2} (ref >59)
GFR calc non Af Amer: 93 mL/min/{1.73_m2} (ref >59)
Globulin, Total: 2.5 g/dL (ref 1.5–4.5)
Glucose: 84 mg/dL (ref 65–99)
Potassium: 4 mmol/L (ref 3.5–5.2)
Sodium: 136 mmol/L (ref 134–144)
Total Protein: 7.1 g/dL (ref 6.0–8.5)

## 2019-01-24 LAB — T4, FREE: Free T4: 1 ng/dL (ref 0.82–1.77)

## 2019-01-24 LAB — VITAMIN B12: Vitamin B-12: 322 pg/mL (ref 232–1245)

## 2019-01-24 LAB — LIPID PANEL WITH LDL/HDL RATIO
Cholesterol, Total: 227 mg/dL — ABNORMAL HIGH (ref 100–199)
HDL: 64 mg/dL (ref >39)
LDL CALC: 147 mg/dL — AB (ref 0–99)
LDl/HDL Ratio: 2.3 ratio (ref 0.0–3.2)
Triglycerides: 78 mg/dL (ref 0–149)
VLDL Cholesterol Cal: 16 mg/dL (ref 5–40)

## 2019-01-24 LAB — FOLATE: Folate: 5 ng/mL (ref >3.0)

## 2019-01-24 LAB — HEMOGLOBIN A1C
Est. average glucose Bld gHb Est-mCnc: 108 mg/dL
Hgb A1c MFr Bld: 5.4 % (ref 4.8–5.6)

## 2019-01-24 LAB — T3: T3, Total: 137 ng/dL (ref 71–180)

## 2019-01-24 LAB — TSH: TSH: 2.46 u[IU]/mL (ref 0.450–4.500)

## 2019-01-24 LAB — VITAMIN D 25 HYDROXY (VIT D DEFICIENCY, FRACTURES): Vit D, 25-Hydroxy: 17.4 ng/mL — ABNORMAL LOW (ref 30.0–100.0)

## 2019-01-24 LAB — INSULIN, RANDOM: INSULIN: 6.8 u[IU]/mL (ref 2.6–24.9)

## 2019-01-24 NOTE — Progress Notes (Signed)
    Donna Wong Nov 10, 1975 [id number]        43 y.o.  G1P0010 Married  RP: LGSIL for Colposcopy  HPI: LGSIL on Pap test 11/07/2018.  No STI screen done recently.  No pelvic pain.  Normal vaginal secretions.  Normal menstrual periods every month.  No fever.   OB History  Gravida Para Term Preterm AB Living  1 0     1 0  SAB TAB Ectopic Multiple Live Births  1            # Outcome Date GA Lbr Len/2nd Weight Sex Delivery Anes PTL Lv  1 SAB             Past medical history,surgical history, problem list, medications, allergies, family history and social history were all reviewed and documented in the EPIC chart.   Directed ROS with pertinent positives and negatives documented in the history of present illness/assessment and plan.  Exam:  Vitals:   01/24/19 1425  BP: 128/86   General appearance:  Normal  Colposcopy Procedure Note Tayleigh Passwater 01/24/2019  Indications:  LGSIL  Procedure Details  The risks and benefits of the procedure and Verbal informed consent obtained.  Speculum placed in vagina and excellent visualization of cervix achieved, cervix swabbed x 3 with acetic acid solution.  Colposcopy Procedure Note Lourine Ellingham 01/24/2019  Indications: LGSIL  Procedure Details  The risks and benefits of the procedure and Verbal informed consent obtained.  Speculum placed in vagina and excellent visualization of cervix achieved, cervix swabbed x 3 with acetic acid solution.  Findings:  Cervix colposcopy: Physical Exam Genitourinary:       Vaginal colposcopy:  Normal  Vulvar colposcopy: Normal  Perirectal colposcopy: Grossly normal  The cervix was sprayed with Hurricane before performing the cervical biopsies.  Specimens:  HPV 16-18-45.  Cervical Bxs at 2-5 and 9 O'clock.  Gono-Chlam.    Complications:  None.  Good hemostasis with Silver Nitrate and Moncel. . Plan:  Per results   Assessment/Plan:  43 y.o. G1P0010   1. LGSIL on Pap smear of  cervix LGSIL on Pap test on November 07, 2018.  Counseling on abnormal Pap and HPV infection done with patient.  Colposcopy procedure explained.  Findings at colposcopy reviewed with patient.  Will manage per results.  HPV 16-18-45 pending as well as cervical biopsies. - Pathology Report  2. Screen for STD (sexually transmitted disease) - Gono-Chlam - HIV antibody (with reflex) - RPR - Hepatitis B Surface AntiGEN - Hepatitis C Antibody  Counseling on above issues and coordination of care more than 50% for 10 minutes.  Genia Del MD, 2:49 PM 01/24/2019

## 2019-01-25 ENCOUNTER — Other Ambulatory Visit: Payer: Self-pay | Admitting: Obstetrics & Gynecology

## 2019-01-26 LAB — HEPATITIS C ANTIBODY: Hep C Virus Ab: <0.1 s/co ratio (ref 0.0–0.9)

## 2019-01-26 LAB — HIV ANTIBODY (ROUTINE TESTING W REFLEX): HIV SCREEN 4TH GENERATION: NONREACTIVE

## 2019-01-26 LAB — RPR: RPR Ser Ql: NONREACTIVE

## 2019-01-26 LAB — HEPATITIS B SURFACE ANTIGEN: Hepatitis B Surface Ag: NEGATIVE

## 2019-01-27 LAB — PATHOLOGY

## 2019-01-28 LAB — GC/CHLAMYDIA PROBE AMP
Chlamydia trachomatis, NAA: NEGATIVE
Neisseria gonorrhoeae by PCR: NEGATIVE

## 2019-01-30 ENCOUNTER — Encounter: Payer: Self-pay | Admitting: Obstetrics & Gynecology

## 2019-01-30 NOTE — Patient Instructions (Signed)
1. LGSIL on Pap smear of cervix LGSIL on Pap test on November 07, 2018.  Counseling on abnormal Pap and HPV infection done with patient.  Colposcopy procedure explained.  Findings at colposcopy reviewed with patient.  Will manage per results.  HPV 16-18-45 pending as well as cervical biopsies. - Pathology Report  2. Screen for STD (sexually transmitted disease) - Gono-Chlam - HIV antibody (with reflex) - RPR - Hepatitis B Surface AntiGEN - Hepatitis C Antibody  Donna Wong, it was a pleasure seeing you today!  I will inform you of your results as soon as they are available.

## 2019-01-31 ENCOUNTER — Encounter (INDEPENDENT_AMBULATORY_CARE_PROVIDER_SITE_OTHER): Payer: Self-pay

## 2019-01-31 ENCOUNTER — Encounter: Payer: Self-pay | Admitting: *Deleted

## 2019-01-31 LAB — HPV GENOTYPES 16/18,45
HPV Genotype 16: NEGATIVE
HPV Genotype 18,45: NEGATIVE

## 2019-02-06 ENCOUNTER — Other Ambulatory Visit: Payer: Self-pay

## 2019-02-06 ENCOUNTER — Encounter (INDEPENDENT_AMBULATORY_CARE_PROVIDER_SITE_OTHER): Payer: Self-pay | Admitting: Family Medicine

## 2019-02-06 ENCOUNTER — Ambulatory Visit (INDEPENDENT_AMBULATORY_CARE_PROVIDER_SITE_OTHER): Payer: Managed Care, Other (non HMO) | Admitting: Family Medicine

## 2019-02-06 DIAGNOSIS — E669 Obesity, unspecified: Secondary | ICD-10-CM | POA: Diagnosis not present

## 2019-02-06 DIAGNOSIS — E559 Vitamin D deficiency, unspecified: Secondary | ICD-10-CM

## 2019-02-06 DIAGNOSIS — Z6830 Body mass index (BMI) 30.0-30.9, adult: Secondary | ICD-10-CM

## 2019-02-06 DIAGNOSIS — E8881 Metabolic syndrome: Secondary | ICD-10-CM

## 2019-02-06 NOTE — Progress Notes (Signed)
Office: [phone]  /  Fax: [phone] TeleHealth Visit:  Marshai Okuno has consented to this TeleHealth visit today via telephone call on WebEx. The patient is located at home, the provider is located at the [facility] office. The participants in this visit include the listed provider and patient, and provider's assistant and registered dietitian.    HPI:   Chief Complaint: OBESITY Donna Wong is here to discuss her progress with her obesity treatment plan. She is on the Category 3 plan and is following her eating plan approximately 50 % of the time. She states she is walking for 20 minutes 4 times per week. Anelisse had a hard time finding food at [employer]. She couldn't find milk initially, couldn't find bread or yogurt (tried to pick the best options). Her in-laws are in town and got stuck here. She has been on the plan for the last 4 days. She hasn't been keeping track of her snacks, but she has gotten calories in most days.  We were unable to weight the patient today for this TeleHealth visit. She feels as if she has maintained her weight since her last visit. She has lost 0 lbs since starting treatment with [place].  Vitamin D Deficiency Suleyma has a diagnosis of vitamin D deficiency. She is not currently taking OTC Vit D. She notes fatigue and denies nausea, vomiting or muscle weakness.  Insulin Resistance Mariapaula has a diagnosis of insulin resistance based on her elevated fasting insulin level >5. Last insulin was of 6.8 and Hgb A1c of 5.4. Although Yomayra's blood glucose readings are still under good control, insulin resistance puts her at greater risk of metabolic syndrome and diabetes. She is not taking metformin currently and notes carbohydrate cravings. She continues to work on diet and exercise to decrease risk of diabetes.  ASSESSMENT AND PLAN:  Vitamin D deficiency - Plan: Vitamin D, Ergocalciferol, (DRISDOL) 1.25 MG (50000 UT) CAPS capsule  Insulin resistance   Class 1 obesity with serious comorbidity and body mass index (BMI) of 30.0 to 30.9 in adult, unspecified obesity type  PLAN:  Vitamin D Deficiency Sheilia was informed that low vitamin D levels contributes to fatigue and are associated with obesity, breast, and colon cancer. Sonny agrees to start prescription Vit D @50 ,000 IU every week #4 with no refills. She will follow up for routine testing of vitamin D, at least 2-3 times per year. She was informed of the risk of over-replacement of vitamin D and agrees to not increase her dose unless she discusses this with [place] first. Prairie agrees to follow up with our clinic in 2 weeks.  Insulin Resistance Mammie will continue to work on weight loss, exercise, and decreasing simple carbohydrates in her diet to help decrease the risk of diabetes. We dicussed metformin including benefits and risks. She was informed that eating too many simple carbohydrates or too many calories at one sitting increases the likelihood of GI side effects. Lea declined metformin for now and prescription was not written today. We will repeat insulin and Hgb A1c in 3 months. Kelsi agrees to follow up with our clinic in 2 weeks as directed to monitor her progress.  Obesity Rhea is currently in the action stage of change. As such, her goal is to continue with weight loss efforts She has agreed to follow the Category 3 plan Adithri has been instructed to work up to a goal of 150 minutes of combined cardio and strengthening exercise per week for weight loss and  overall health benefits. We discussed the following Behavioral Modification Strategies today: increasing lean protein intake, increasing vegetables and work on meal planning and easy cooking plans, better snacking choices, and planning for success   Patrici has agreed to follow up with our clinic in 2 weeks. She was informed of the importance of frequent follow up visits to maximize her success with intensive lifestyle  modifications for her multiple health conditions.  ALLERGIES: No Known Allergies  MEDICATIONS: Current Outpatient Medications on File Prior to Visit  Medication Sig Dispense Refill  . Biotin 1000 MCG CHEW Chew 1 capsule by mouth daily.    Marland Kitchen ibuprofen (ADVIL,MOTRIN) 200 MG tablet Take 200 mg by mouth every 6 (six) hours as needed.    . Multiple Vitamin (MULTIVITAMIN) tablet Take 1 tablet by mouth daily.     No current facility-administered medications on file prior to visit.     PAST MEDICAL HISTORY: Past Medical History:  Diagnosis Date  . Constipation   . High blood pressure   . High cholesterol   . Infertility, female   . Swelling     PAST SURGICAL HISTORY: Past Surgical History:  Procedure Laterality Date  . DILATION AND CURETTAGE OF UTERUS      SOCIAL HISTORY: Social History   Tobacco Use  . Smoking status: Former Smoker    Last attempt to quit: 07/26/2010    Years since quitting: 8.5  . Smokeless tobacco: Never Used  Substance Use Topics  . Alcohol use: Yes    Alcohol/week: 2.0 standard drinks    Types: 2 Cans of beer per week    Comment: 2 drinks a week   . Drug use: No    FAMILY HISTORY: Family History  Problem Relation Age of Onset  . Heart attack Father   . Heart disease Father     ROS: Review of Systems  Constitutional: Positive for malaise/fatigue. Negative for weight loss.  Gastrointestinal: Negative for nausea and vomiting.  Musculoskeletal:       Negative muscle weakness    PHYSICAL EXAM: Pt in no acute distress  RECENT LABS AND TESTS: BMET    Component Value Date/Time   NA 136 01/23/2019 1020   K 4.0 01/23/2019 1020   CL 99 01/23/2019 1020   CO2 22 01/23/2019 1020   GLUCOSE 84 01/23/2019 1020   BUN 10 01/23/2019 1020   CREATININE 0.80 01/23/2019 1020   CALCIUM 9.8 01/23/2019 1020   GFRNONAA 93 01/23/2019 1020   GFRAA 107 01/23/2019 1020   Lab Results  Component Value Date   HGBA1C 5.4 01/23/2019   Lab Results   Component Value Date   INSULIN 6.8 01/23/2019   CBC    Component Value Date/Time   WBC 11.4 (H) 01/23/2019 1020   RBC 4.54 01/23/2019 1020   HGB 13.9 01/23/2019 1020   HCT 40.0 01/23/2019 1020   MCV 88 01/23/2019 1020   MCH 30.6 01/23/2019 1020   MCHC 34.8 01/23/2019 1020   RDW 13.0 01/23/2019 1020   LYMPHSABS 2.0 01/23/2019 1020   EOSABS 0.1 01/23/2019 1020   BASOSABS 0.1 01/23/2019 1020   Iron/TIBC/Ferritin/ %Sat No results found for: IRON, TIBC, FERRITIN, IRONPCTSAT Lipid Panel     Component Value Date/Time   CHOL 227 (H) 01/23/2019 1020   TRIG 78 01/23/2019 1020   HDL 64 01/23/2019 1020   LDLCALC 147 (H) 01/23/2019 1020   Hepatic Function Panel     Component Value Date/Time   PROT 7.1 01/23/2019 1020   ALBUMIN  4.6 01/23/2019 1020   AST 15 01/23/2019 1020   ALT 16 01/23/2019 1020   ALKPHOS 72 01/23/2019 1020   BILITOT 0.3 01/23/2019 1020      Component Value Date/Time   TSH 2.460 01/23/2019 1020      I, Burt Knack, am acting as transcriptionist for Debbra Riding, MD  I have reviewed the above documentation for accuracy and completeness, and I agree with the above. - Debbra Riding, MD

## 2019-02-08 MED ORDER — VITAMIN D (ERGOCALCIFEROL) 1.25 MG (50000 UNIT) PO CAPS: 50000.0000 [IU] | ORAL_CAPSULE | ORAL | 0 refills | Status: DC

## 2019-02-14 ENCOUNTER — Encounter (INDEPENDENT_AMBULATORY_CARE_PROVIDER_SITE_OTHER): Payer: Self-pay | Admitting: Family Medicine

## 2019-02-14 NOTE — Telephone Encounter (Signed)
FYI

## 2019-02-20 ENCOUNTER — Other Ambulatory Visit: Payer: Self-pay

## 2019-02-20 ENCOUNTER — Ambulatory Visit (INDEPENDENT_AMBULATORY_CARE_PROVIDER_SITE_OTHER): Payer: Managed Care, Other (non HMO) | Admitting: Family Medicine

## 2019-02-20 ENCOUNTER — Encounter (INDEPENDENT_AMBULATORY_CARE_PROVIDER_SITE_OTHER): Payer: Self-pay | Admitting: Family Medicine

## 2019-02-20 DIAGNOSIS — E559 Vitamin D deficiency, unspecified: Secondary | ICD-10-CM

## 2019-02-20 DIAGNOSIS — E669 Obesity, unspecified: Secondary | ICD-10-CM | POA: Diagnosis not present

## 2019-02-20 DIAGNOSIS — Z6830 Body mass index (BMI) 30.0-30.9, adult: Secondary | ICD-10-CM | POA: Diagnosis not present

## 2019-02-20 DIAGNOSIS — E7849 Other hyperlipidemia: Secondary | ICD-10-CM | POA: Diagnosis not present

## 2019-02-20 NOTE — Progress Notes (Signed)
Office: [phone]  /  Fax: [phone] TeleHealth Visit:  Donna Wong has verbally consented to this TeleHealth visit today. The patient is located at home, the provider is located at the [facility] office. The participants in this visit include the listed provider and patient and provider's assistant. The visit was conducted today via webex.  HPI:   Chief Complaint: OBESITY Donna Wong is here to discuss her progress with her obesity treatment plan. She is on the Category 3 plan and is following her eating plan approximately 85 % of the time. She states she is walking for 30 minutes 7 times per week. Donna Wong is feeling very bloated the last week. She wasn't as strict on her meal plan last week. She is feeling like the days are all blurring together. She notes her period is supposed to come next week. She is getting snack calories in and adding to the meals.  We were unable to weigh the patient today for this TeleHealth visit. She feels as if she has maintained her weight since her last visit. She has lost 0 lbs since starting treatment with [place].  Vitamin D Deficiency Donna Wong has a diagnosis of vitamin D deficiency. She is currently taking Vit D supplement. She notes fatigue and denies nausea, vomiting or muscle weakness.  Hyperlipidemia Donna Wong has hyperlipidemia and has been trying to improve her cholesterol levels with intensive lifestyle modification including a low saturated fat diet, exercise and weight loss. Her LDL was elevated at 147 and she is not on any medications. She denies any chest pain, claudication or myalgias.  ASSESSMENT AND PLAN:  Vitamin D deficiency  Other hyperlipidemia  Class 1 obesity with serious comorbidity and body mass index (BMI) of 30.0 to 30.9 in adult, unspecified obesity type  PLAN:  Vitamin D Deficiency Donna Wong was informed that low vitamin D levels contributes to fatigue and are associated with obesity, breast, and colon cancer. Donna Wong  agrees to continue taking Vit D supplement and will follow up for routine testing of vitamin D, at least 2-3 times per year. She was informed of the risk of over-replacement of vitamin D and agrees to not increase her dose unless she discusses this with [place] first. Donna Wong agrees to follow up with our clinic in 2 weeks.  Hyperlipidemia Donna Wong was informed of the American Heart Association Guidelines emphasizing intensive lifestyle modifications as the first line treatment for hyperlipidemia. We discussed many lifestyle modifications today in depth, and Donna Wong will continue to work on decreasing saturated fats such as fatty red meat, butter and many fried foods. She will also increase vegetables and lean protein in her diet and continue to work on exercise and weight loss efforts. We will repeat labs at the end of June. Donna Wong agrees to follow up with our clinic in 2 weeks.  Obesity Donna Wong is currently in the action stage of change. As such, her goal is to continue with weight loss efforts She has agreed to follow the Category 3 plan Donna Wong has been instructed to work up to a goal of 150 minutes of combined cardio and strengthening exercise per week or she is to increase walking to twice a day for weight loss and overall health benefits. We discussed the following Behavioral Modification Strategies today: increasing lean protein intake, increasing vegetables, work on meal planning and easy cooking plans, better snacking choices, and planning for success   Donna Wong has agreed to follow up with our clinic in 2 weeks. She was informed of the importance  of frequent follow up visits to maximize her success with intensive lifestyle modifications for her multiple health conditions.  ALLERGIES: No Known Allergies  MEDICATIONS: Current Outpatient Medications on File Prior to Visit  Medication Sig Dispense Refill  . Biotin 1000 MCG CHEW Chew 1 capsule by mouth daily.    Marland Kitchen ibuprofen (ADVIL,MOTRIN) 200 MG tablet  Take 200 mg by mouth every 6 (six) hours as needed.    . Multiple Vitamin (MULTIVITAMIN) tablet Take 1 tablet by mouth daily.    . Vitamin D, Ergocalciferol, (DRISDOL) 1.25 MG (50000 UT) CAPS capsule Take 1 capsule (50,000 Units total) by mouth every 7 (seven) days. 4 capsule 0   No current facility-administered medications on file prior to visit.     PAST MEDICAL HISTORY: Past Medical History:  Diagnosis Date  . Constipation   . High blood pressure   . High cholesterol   . Infertility, female   . Swelling     PAST SURGICAL HISTORY: Past Surgical History:  Procedure Laterality Date  . DILATION AND CURETTAGE OF UTERUS      SOCIAL HISTORY: Social History   Tobacco Use  . Smoking status: Former Smoker    Last attempt to quit: 07/26/2010    Years since quitting: 8.5  . Smokeless tobacco: Never Used  Substance Use Topics  . Alcohol use: Yes    Alcohol/week: 2.0 standard drinks    Types: 2 Cans of beer per week    Comment: 2 drinks a week   . Drug use: No    FAMILY HISTORY: Family History  Problem Relation Age of Onset  . Heart attack Father   . Heart disease Father     ROS: Review of Systems  Constitutional: Positive for malaise/fatigue. Negative for weight loss.  Cardiovascular: Negative for chest pain and claudication.  Gastrointestinal: Negative for nausea and vomiting.  Musculoskeletal: Negative for myalgias.       Negative muscle weakness    PHYSICAL EXAM: Pt in no acute distress  RECENT LABS AND TESTS: BMET    Component Value Date/Time   NA 136 01/23/2019 1020   K 4.0 01/23/2019 1020   CL 99 01/23/2019 1020   CO2 22 01/23/2019 1020   GLUCOSE 84 01/23/2019 1020   BUN 10 01/23/2019 1020   CREATININE 0.80 01/23/2019 1020   CALCIUM 9.8 01/23/2019 1020   GFRNONAA 93 01/23/2019 1020   GFRAA 107 01/23/2019 1020   Lab Results  Component Value Date   HGBA1C 5.4 01/23/2019   Lab Results  Component Value Date   INSULIN 6.8 01/23/2019   CBC     Component Value Date/Time   WBC 11.4 (H) 01/23/2019 1020   RBC 4.54 01/23/2019 1020   HGB 13.9 01/23/2019 1020   HCT 40.0 01/23/2019 1020   MCV 88 01/23/2019 1020   MCH 30.6 01/23/2019 1020   MCHC 34.8 01/23/2019 1020   RDW 13.0 01/23/2019 1020   LYMPHSABS 2.0 01/23/2019 1020   EOSABS 0.1 01/23/2019 1020   BASOSABS 0.1 01/23/2019 1020   Iron/TIBC/Ferritin/ %Sat No results found for: IRON, TIBC, FERRITIN, IRONPCTSAT Lipid Panel     Component Value Date/Time   CHOL 227 (H) 01/23/2019 1020   TRIG 78 01/23/2019 1020   HDL 64 01/23/2019 1020   LDLCALC 147 (H) 01/23/2019 1020   Hepatic Function Panel     Component Value Date/Time   PROT 7.1 01/23/2019 1020   ALBUMIN 4.6 01/23/2019 1020   AST 15 01/23/2019 1020   ALT 16 01/23/2019 1020  ALKPHOS 72 01/23/2019 1020   BILITOT 0.3 01/23/2019 1020      Component Value Date/Time   TSH 2.460 01/23/2019 1020      I, Burt KnackSharon Martin, am acting as transcriptionist for Debbra RidingAlexandria Kadolph, MD  I have reviewed the above documentation for accuracy and completeness, and I agree with the above. - Debbra RidingAlexandria Kadolph, MD

## 2019-03-06 ENCOUNTER — Ambulatory Visit (INDEPENDENT_AMBULATORY_CARE_PROVIDER_SITE_OTHER): Payer: Managed Care, Other (non HMO) | Admitting: Family Medicine

## 2019-05-08 ENCOUNTER — Telehealth (INDEPENDENT_AMBULATORY_CARE_PROVIDER_SITE_OTHER): Payer: Managed Care, Other (non HMO) | Admitting: Family Medicine

## 2019-09-22 ENCOUNTER — Other Ambulatory Visit: Payer: Self-pay

## 2019-09-22 DIAGNOSIS — Z20822 Contact with and (suspected) exposure to covid-19: Secondary | ICD-10-CM

## 2019-09-25 LAB — NOVEL CORONAVIRUS, NAA: SARS-CoV-2, NAA: NOT DETECTED

## 2019-09-26 ENCOUNTER — Other Ambulatory Visit: Payer: Self-pay

## 2019-09-26 DIAGNOSIS — Z20822 Contact with and (suspected) exposure to covid-19: Secondary | ICD-10-CM

## 2019-09-28 LAB — NOVEL CORONAVIRUS, NAA: SARS-CoV-2, NAA: DETECTED — AB

## 2019-11-09 ENCOUNTER — Encounter: Payer: Managed Care, Other (non HMO) | Admitting: Obstetrics & Gynecology

## 2019-12-06 IMAGING — MG DIGITAL SCREENING BILATERAL MAMMOGRAM WITH TOMO AND CAD
8 series · 8 of 24 positions shown · non-contrast
Comparison: Previous exam(s).

CLINICAL DATA: Screening.

EXAM:
DIGITAL SCREENING BILATERAL MAMMOGRAM WITH TOMO AND CAD

[L CC synth-2D]
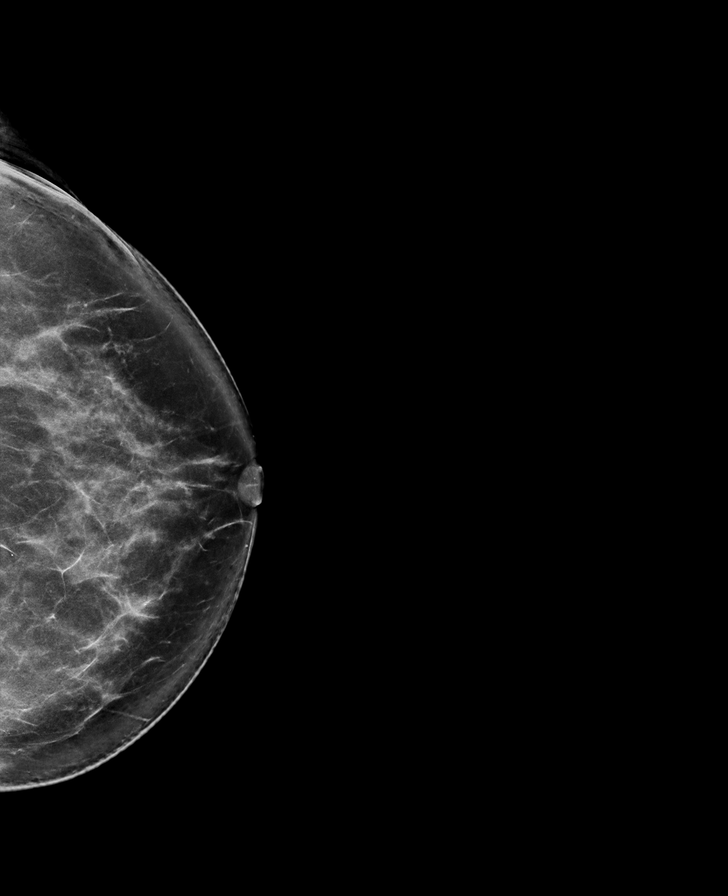

[R MLO synth-2D]
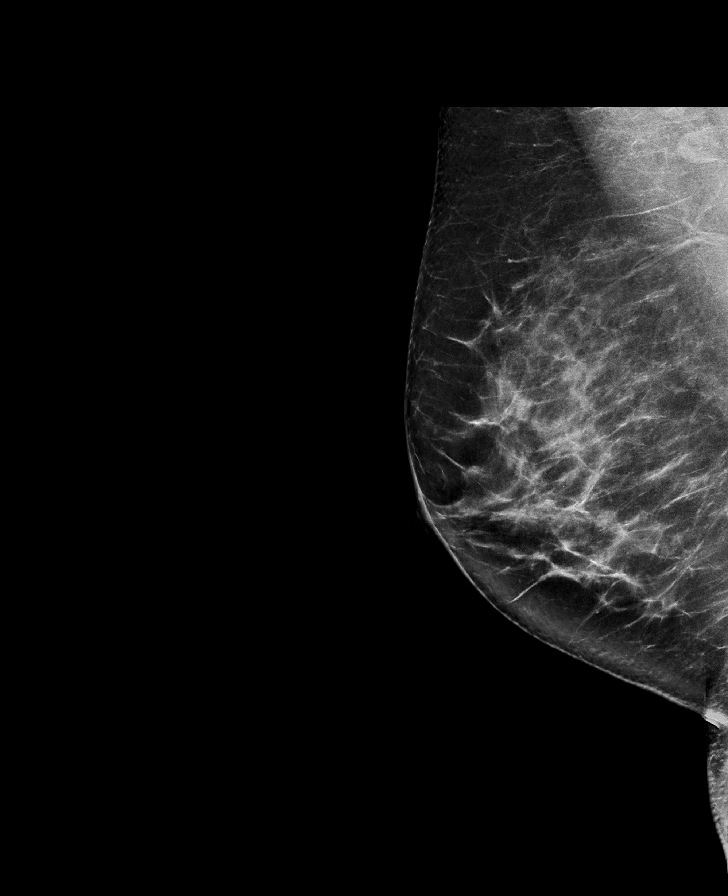

[R CC synth-2D]
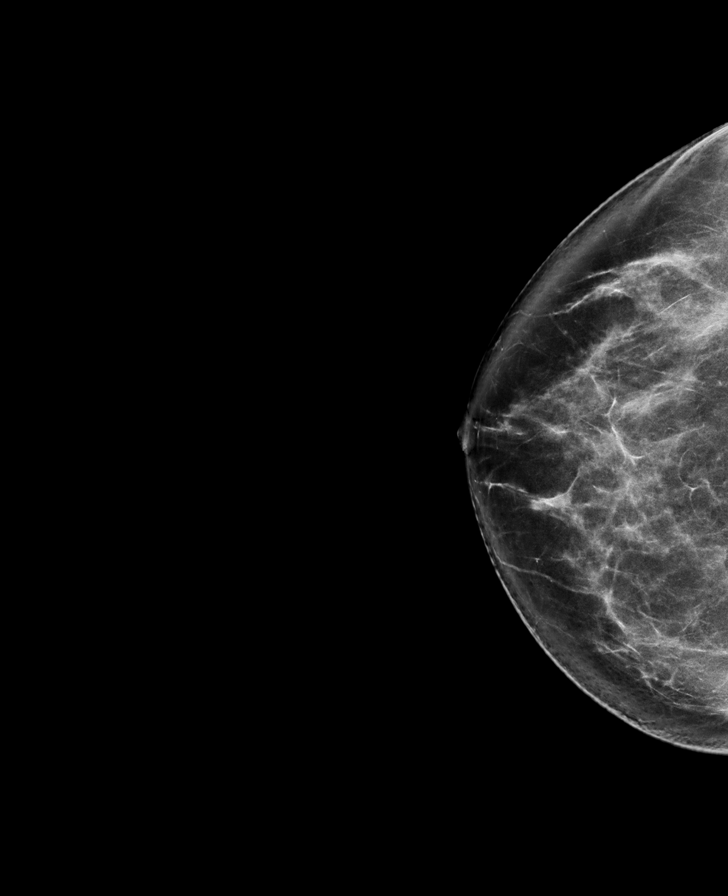

[L MLO synth-2D]
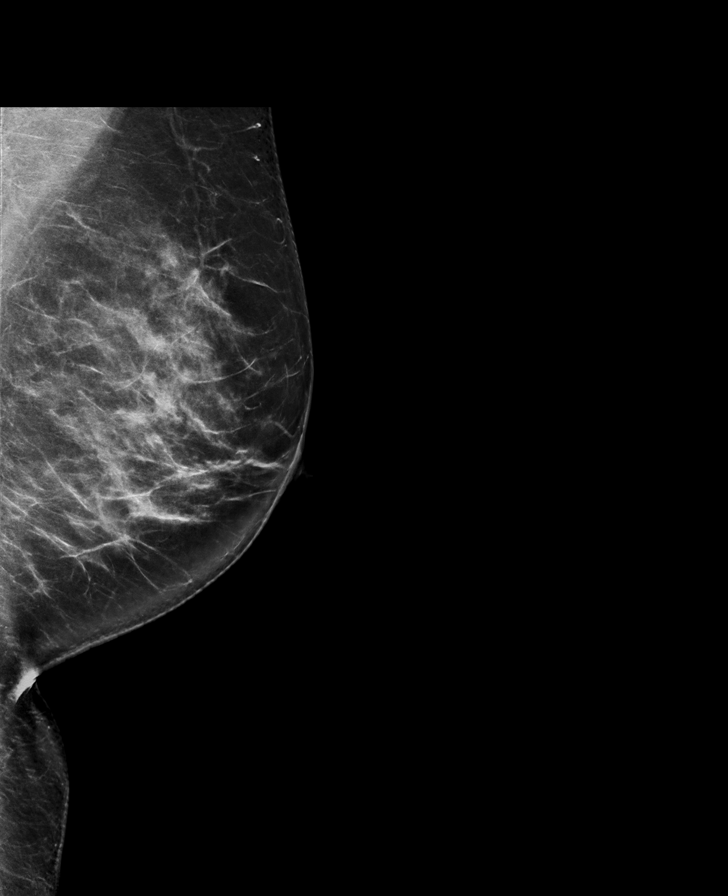

[R MLO tomo · tomo slice 45/88.0]
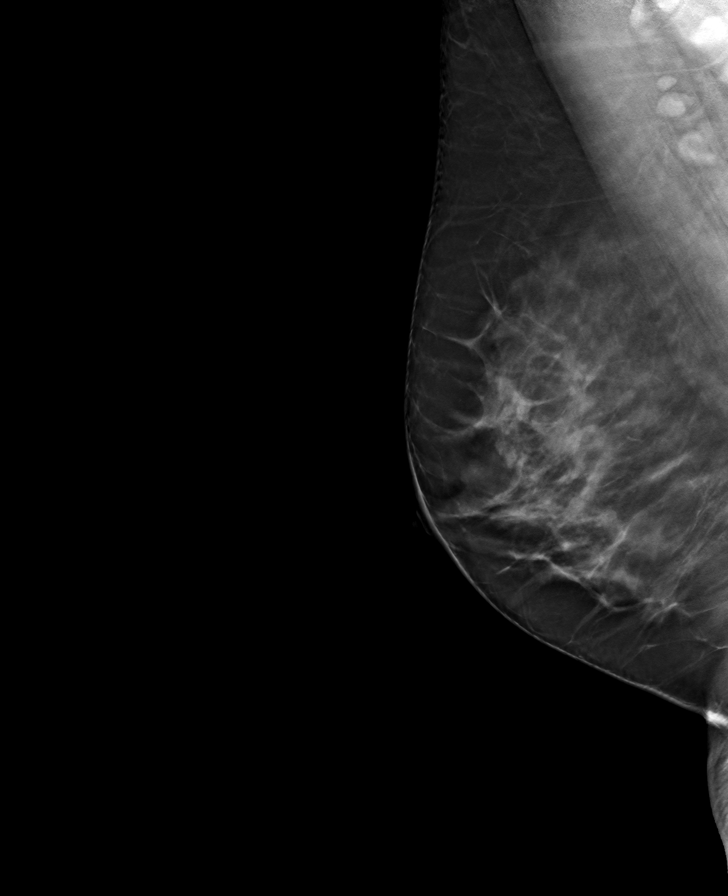

[L CC tomo · tomo slice 49/96.0]
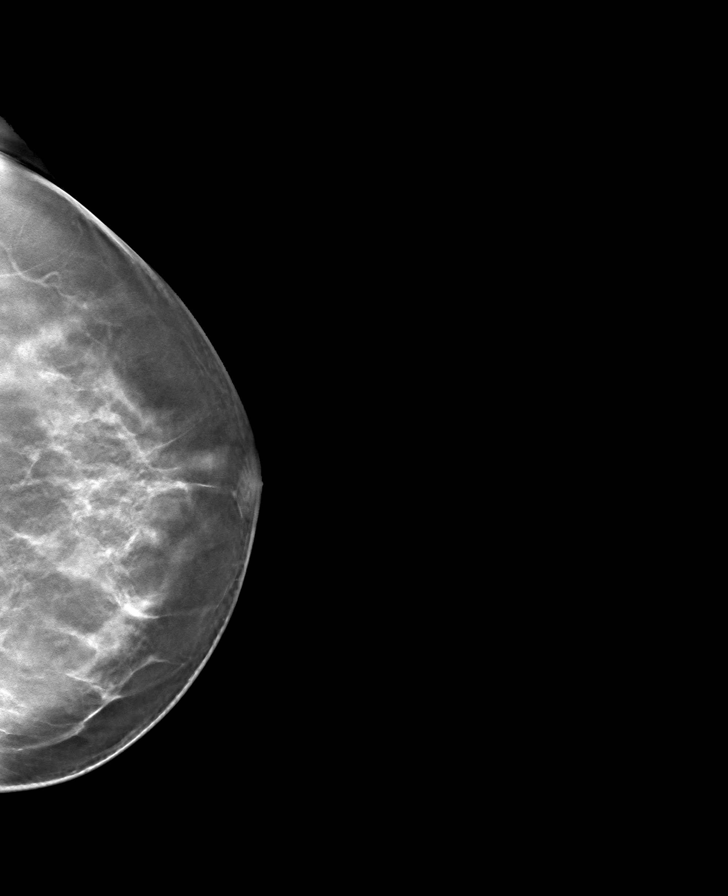

[L MLO tomo · tomo slice 45/88.0]
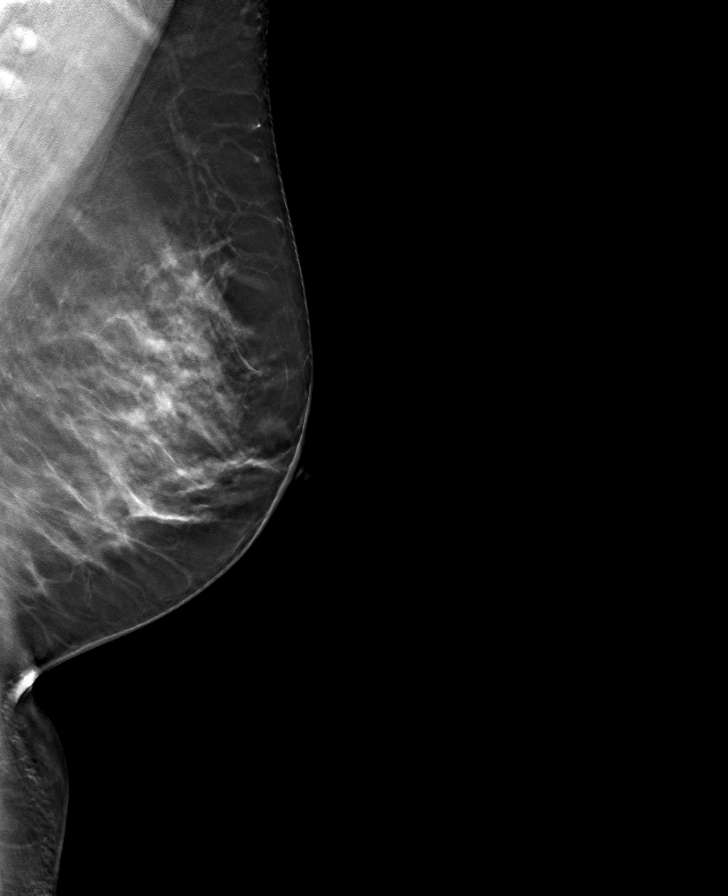

[R CC tomo · tomo slice 47/92.0]
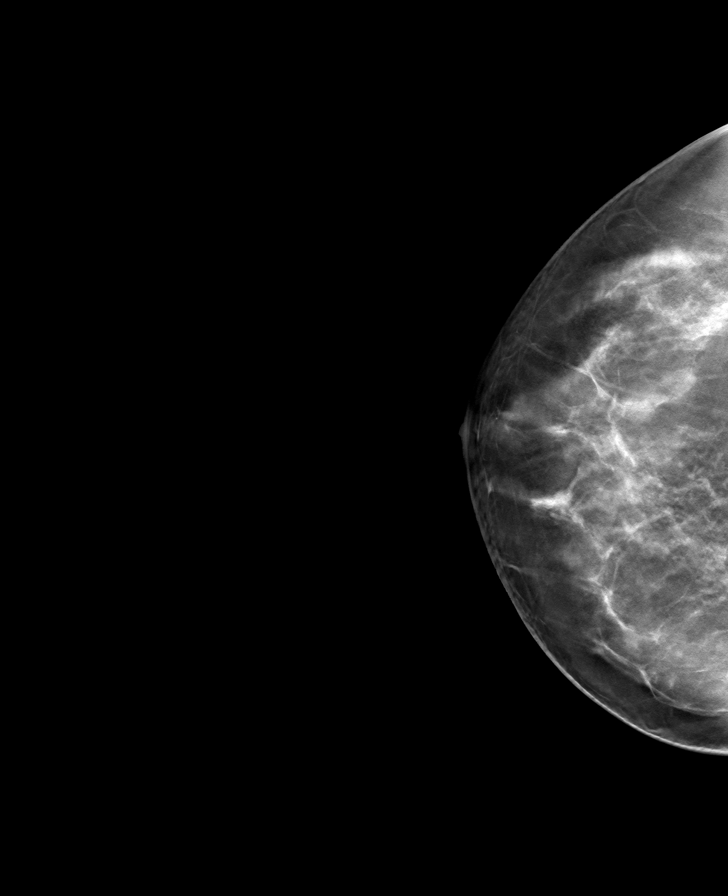

[8 of 24 positions shown; findings below may reference images not displayed]

ACR Breast Density Category c: The breast tissue is heterogeneously
dense, which may obscure small masses.
FINDINGS: There are no findings suspicious for malignancy. Images were
processed with CAD.
IMPRESSION: No mammographic evidence of malignancy. A result letter of this
screening mammogram will be mailed directly to the patient.

RECOMMENDATION:
Screening mammogram in one year. (Code:[id number])

BI-RADS CATEGORY  1: Negative.

## 2021-04-16 DIAGNOSIS — F330 Major depressive disorder, recurrent, mild: Secondary | ICD-10-CM | POA: Diagnosis not present

## 2022-12-25 DIAGNOSIS — F330 Major depressive disorder, recurrent, mild: Secondary | ICD-10-CM | POA: Diagnosis not present

## 2022-12-29 ENCOUNTER — Ambulatory Visit: Payer: Self-pay

## 2023-01-08 DIAGNOSIS — F330 Major depressive disorder, recurrent, mild: Secondary | ICD-10-CM | POA: Diagnosis not present

## 2023-01-15 DIAGNOSIS — Z0000 Encounter for general adult medical examination without abnormal findings: Secondary | ICD-10-CM | POA: Diagnosis not present

## 2023-01-15 DIAGNOSIS — F902 Attention-deficit hyperactivity disorder, combined type: Secondary | ICD-10-CM | POA: Diagnosis not present

## 2023-01-15 DIAGNOSIS — Z79899 Other long term (current) drug therapy: Secondary | ICD-10-CM | POA: Diagnosis not present

## 2023-04-16 DIAGNOSIS — Z79899 Other long term (current) drug therapy: Secondary | ICD-10-CM | POA: Diagnosis not present

## 2023-04-16 DIAGNOSIS — F902 Attention-deficit hyperactivity disorder, combined type: Secondary | ICD-10-CM | POA: Diagnosis not present

## 2023-08-13 ENCOUNTER — Other Ambulatory Visit: Payer: Self-pay | Admitting: Gynecology

## 2023-08-13 DIAGNOSIS — R928 Other abnormal and inconclusive findings on diagnostic imaging of breast: Secondary | ICD-10-CM

## 2023-09-01 ENCOUNTER — Other Ambulatory Visit: Payer: Self-pay

## 2023-09-01 MED ORDER — WEGOVY 0.25 MG/0.5ML ~~LOC~~ SOAJ
0.2500 mg | SUBCUTANEOUS | 0 refills | Status: AC
  Filled 2023-09-01: qty 2, 28d supply, fill #0

## 2023-09-03 ENCOUNTER — Other Ambulatory Visit: Payer: Self-pay

## 2023-09-06 ENCOUNTER — Ambulatory Visit
Admission: RE | Admit: 2023-09-06 | Discharge: 2023-09-06 | Disposition: A | Discharge status: Home / Self Care | Payer: Commercial Managed Care - HMO | Source: Ambulatory Visit | Attending: Gynecology | Admitting: Gynecology

## 2023-09-06 ENCOUNTER — Ambulatory Visit: Payer: Self-pay

## 2023-09-06 DIAGNOSIS — R928 Other abnormal and inconclusive findings on diagnostic imaging of breast: Secondary | ICD-10-CM

## 2023-09-14 ENCOUNTER — Other Ambulatory Visit: Payer: Self-pay

## 2023-09-14 ENCOUNTER — Encounter (HOSPITAL_BASED_OUTPATIENT_CLINIC_OR_DEPARTMENT_OTHER): Payer: Self-pay | Admitting: Obstetrics and Gynecology

## 2023-09-14 DIAGNOSIS — Z01812 Encounter for preprocedural laboratory examination: Secondary | ICD-10-CM | POA: Diagnosis present

## 2023-09-14 DIAGNOSIS — D251 Intramural leiomyoma of uterus: Secondary | ICD-10-CM | POA: Diagnosis not present

## 2023-09-14 NOTE — Progress Notes (Signed)
Your procedure is scheduled on Thursday, 09/23/2023.  Report to [facility] AT  7:30 AM.   Call this number if you have problems the morning of surgery  [phone].   OUR ADDRESS IS [address].  [facility] ARE LOCATED IN [facility].  PLEASE BRING YOUR INSURANCE CARD AND PHOTO ID DAY OF SURGERY.  ONLY 2 PEOPLE ARE ALLOWED IN  WAITING  ROOM                                      REMEMBER:  DO NOT EAT FOOD, CANDY GUM OR MINTS  AFTER MIDNIGHT THE NIGHT BEFORE YOUR SURGERY . YOU MAY HAVE CLEAR LIQUIDS FROM MIDNIGHT THE NIGHT BEFORE YOUR SURGERY UNTIL  6:30 AM. NO CLEAR LIQUIDS AFTER   6:30 AM DAY OF SURGERY.  YOU MAY  BRUSH YOUR TEETH MORNING OF SURGERY AND RINSE YOUR MOUTH OUT, NO CHEWING GUM CANDY OR MINTS.     CLEAR LIQUID DIET    Allowed      Water                                                                   Coffee and tea, regular and decaf  (NO cream or milk products of any type, may sweeten)                         Carbonated beverages, regular and diet                                    Sports drinks like Gatorade _____________________________________________________________________     TAKE ONLY THESE MEDICATIONS MORNING OF SURGERY:  Lexapro Do NOT take Adzenys on the morning of surgery. Hold Wegovy x at least 7 days. Your last dose of Wegovy before surgery should be on 09/13/23.                                        DO NOT WEAR JEWERLY/  METAL/  PIERCINGS (INCLUDING NO PLASTIC PIERCINGS) DO NOT WEAR LOTIONS, POWDERS, PERFUMES OR NAIL POLISH ON YOUR FINGERNAILS. TOENAIL POLISH IS OK TO WEAR. DO NOT SHAVE FOR 48 HOURS PRIOR TO DAY OF SURGERY.  CONTACTS, GLASSES, OR DENTURES MAY NOT BE WORN TO SURGERY.  REMEMBER: NO SMOKING, VAPING ,  DRUGS OR ALCOHOL FOR 24 HOURS BEFORE YOUR SURGERY.                                    [place] IS NOT RESPONSIBLE  FOR ANY BELONGINGS.                                                                     Marland Kitchen  [facility] - Preparing for Surgery Before surgery, you can play an important role.  Because skin is not sterile, your skin needs to be as free of germs as possible.  You can reduce the number of germs on your skin by washing with CHG (chlorahexidine gluconate) soap before surgery.  CHG is an antiseptic cleaner which kills germs and bonds with the skin to continue killing germs even after washing. Please DO NOT use if you have an allergy to CHG or antibacterial soaps.  If your skin becomes reddened/irritated stop using the CHG and inform your nurse when you arrive at Short Stay. Do not shave (including legs and underarms) for at least 48 hours prior to the first CHG shower.  You may shave your face/neck. Please follow these instructions carefully:  1.  Shower with CHG Soap the night before surgery and the  morning of Surgery.  2.  If you choose to wash your hair, wash your hair first as usual with your  normal  shampoo.  3.  After you shampoo, rinse your hair and body thoroughly to remove the  shampoo.                                        4.  Use CHG as you would any other liquid soap.  You can apply chg directly  to the skin and wash , chg soap provided, night before and morning of your surgery.  5.  Apply the CHG Soap to your body ONLY FROM THE NECK DOWN.   Do not use on face/ open                           Wound or open sores. Avoid contact with eyes, ears mouth and genitals (private parts).                       Wash face,  Genitals (private parts) with your normal soap.             6.  Wash thoroughly, paying special attention to the area where your surgery  will be performed.  7.  Thoroughly rinse your body with warm water from the neck down.  8.  DO NOT shower/wash with your normal soap after using and rinsing off  the CHG Soap.             9.  Pat yourself dry with a clean towel.            10.  Wear clean pajamas.            11.  Place clean sheets on your bed  the night of your first shower and do not  sleep with pets. Day of Surgery : Do not apply any lotions/ powders the morning of surgery.  Please wear clean clothes to the hospital/surgery center.  IF YOU HAVE ANY SKIN IRRITATION OR PROBLEMS WITH THE SURGICAL SOAP, PLEASE GET A BAR OF GOLD DIAL SOAP AND SHOWER THE NIGHT BEFORE YOUR SURGERY AND THE MORNING OF YOUR SURGERY. PLEASE LET THE NURSE KNOW MORNING OF YOUR SURGERY IF YOU HAD ANY PROBLEMS WITH THE SURGICAL SOAP.   YOUR SURGEON MAY HAVE REQUESTED EXTENDED RECOVERY TIME AFTER YOUR SURGERY. IT COULD BE A  JUST A FEW HOURS  UP TO AN OVERNIGHT STAY.  YOUR SURGEON SHOULD HAVE DISCUSSED  THIS WITH YOU PRIOR TO YOUR SURGERY. IN THE EVENT YOU NEED TO STAY OVERNIGHT PLEASE REFER TO THE FOLLOWING GUIDELINES. YOU MAY HAVE UP TO 4 VISITORS  MAY VISIT IN THE EXTENDED RECOVERY ROOM UNTIL 800 PM ONLY.  ONE  VISITOR AGE 44 AND OVER MAY SPEND THE NIGHT AND MUST BE IN EXTENDED RECOVERY ROOM NO LATER THAN 800 PM . YOUR DISCHARGE TIME AFTER YOU SPEND THE NIGHT IS 900 AM THE MORNING AFTER YOUR SURGERY. YOU MAY PACK A SMALL OVERNIGHT BAG WITH TOILETRIES FOR YOUR OVERNIGHT STAY IF YOU WISH.  REGARDLESS OF IF YOU STAY OVER NIGHT OR ARE DISCHARGED THE SAME DAY YOU WILL BE REQUIRED TO HAVE A RESPONSIBLE ADULT (18 YRS OLD OR OLDER) STAY WITH YOU FOR AT LEAST THE FIRST 24 HOURS  YOUR PRESCRIPTION MEDICATIONS WILL BE PROVIDED DURING YOUR HOSPITAL STAY.  ________________________________________________________________________                                                        QUESTIONS Donna Wong PRE OP NURSE PHONE [phone].

## 2023-09-14 NOTE — Progress Notes (Signed)
Spoke w/ via phone for pre-op interview---Donna Wong needs dos----  UPT       Wong results------09/16/23 Wong appt for cbc, type & screen COVID test -----patient states asymptomatic no test needed Arrive at -------0730 on Thursday, 09/23/23 NPO after MN NO Solid Food.  Clear liquids from MN until---0630 Med rec completed Medications to take morning of surgery -----Lexapro, Do NOT take Adzenys on the morning of surgery. Diabetic medication -----Hold Wegovy x at least 7 days prior to surgery. Last dose was on 09/13/23 per pt. Patient instructed no nail polish to be worn day of surgery Patient instructed to bring photo id and insurance card day of surgery Patient aware to have Driver (ride ) / caregiver    for 24 hours after surgery - husband, Donna Wong Patient Special Instructions -----Extended / overnight stay instructions given.  Pre-Op special Instructions -----none Patient verbalized understanding of instructions that were given at this phone interview. Patient denies chest pain, sob, fever, cough at the interview.

## 2023-09-16 ENCOUNTER — Encounter (HOSPITAL_COMMUNITY)
Admission: RE | Admit: 2023-09-16 | Discharge: 2023-09-16 | Disposition: A | Discharge status: Home / Self Care | Payer: 59 | Source: Ambulatory Visit | Attending: Obstetrics and Gynecology | Admitting: Obstetrics and Gynecology

## 2023-09-16 DIAGNOSIS — Z01812 Encounter for preprocedural laboratory examination: Secondary | ICD-10-CM | POA: Insufficient documentation

## 2023-09-16 DIAGNOSIS — D251 Intramural leiomyoma of uterus: Secondary | ICD-10-CM | POA: Insufficient documentation

## 2023-09-16 LAB — CBC
HCT: 44.1 % (ref 36.0–46.0)
Hemoglobin: 14.5 g/dL (ref 12.0–15.0)
MCH: 31 pg (ref 26.0–34.0)
MCHC: 32.9 g/dL (ref 30.0–36.0)
MCV: 94.4 fL (ref 80.0–100.0)
Platelets: 385 10*3/uL (ref 150–400)
RBC: 4.67 MIL/uL (ref 3.87–5.11)
RDW: 12.1 % (ref 11.5–15.5)
WBC: 9.1 10*3/uL (ref 4.0–10.5)
nRBC: 0 % (ref 0.0–0.2)

## 2023-09-22 NOTE — H&P (Signed)
Donna Wong is an 47 y.o. female.  Recent abdominal pain associated with intercourse. Hx of fibroids X 10 years Menses are becoming heavier with 3 days of heavy bleeding, clots and cramping.  On ultrasound she has at least 7 measurable myomas up to 6.9 cm, uterine fundus at U-2 on exam.  She desires definitive surgical management.  Pertinent Gynecological History: Last mammogram: abnormal: nl diagnostic study  Date: 07/2023 Last pap: normal Date: 07/2023 OB History: G1, P0010   Menstrual History: Patient's last menstrual period was 09/07/2023 (exact date).    Past Medical History:  Diagnosis Date   ADHD (attention deficit hyperactivity disorder)    Follows w/ Lovenia Kim, PA at [facility].   Anxiety    Complication of anesthesia    Constipation    Depression    Follows w/ Dr. Paulino Rily, PCP.   High blood pressure    Hx of HTN, no meds, Bp has been normal. Follows w/ Dr. Paulino Rily.   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 Her smoking use included cigarettes. She has never used smokeless tobacco. She reports current alcohol use of about 3.0 standard drinks of alcohol per week. She reports that she does not use drugs.  Allergies: No Known Allergies  No medications prior to admission.    Review of Systems  Respiratory: Negative.    Cardiovascular: Negative.     Height 5\' 1"  (1.549 m), weight 83 kg, last menstrual period 09/07/2023. Physical Exam Constitutional:       Appearance: Normal appearance.  Cardiovascular:     Rate and Rhythm: Normal rate and regular rhythm.     Heart sounds: Normal heart sounds. No murmur heard. Pulmonary:     Effort: Pulmonary effort is normal. No respiratory distress.     Breath sounds: Normal breath sounds. No wheezing.  Abdominal:     General: There is no distension.     Palpations: Abdomen is soft.     Tenderness: There is no abdominal tenderness.     Comments: Irregular uterine fundus at U-2  Genitourinary:    General: Normal vulva.     Comments: Uterus irregular 18 weeks size Musculoskeletal:     Cervical back: Normal range of motion and neck supple.  Neurological:     Mental Status: She is alert.     No results found for this or any previous visit (from the past 24 hour(s)).  No results found.  Assessment/Plan: Symptomatic 18 week size myomatous uterus.  All medical and surgical options discussed, she is ready for definitive surgical management.  Surgical procedure, risks, alternatives, chances of relieving symptoms all discussed, questions answered.  Will admit for LSH/bilateral salpingectomy, possibility of needing TAH discussed  Leighton Roach Keeana Pieratt 09/22/2023, 7:43 PM

## 2023-09-23 ENCOUNTER — Ambulatory Visit (HOSPITAL_BASED_OUTPATIENT_CLINIC_OR_DEPARTMENT_OTHER): Payer: 59 | Admitting: Anesthesiology

## 2023-09-23 ENCOUNTER — Encounter (HOSPITAL_BASED_OUTPATIENT_CLINIC_OR_DEPARTMENT_OTHER): Payer: Self-pay | Admitting: Obstetrics and Gynecology

## 2023-09-23 ENCOUNTER — Other Ambulatory Visit: Payer: Self-pay

## 2023-09-23 ENCOUNTER — Observation Stay (HOSPITAL_BASED_OUTPATIENT_CLINIC_OR_DEPARTMENT_OTHER)
Admission: RE | Admit: 2023-09-23 | Discharge: 2023-09-24 | Disposition: A | Discharge status: Home / Self Care | Payer: 59 | Source: Ambulatory Visit | Attending: Obstetrics and Gynecology | Admitting: Obstetrics and Gynecology

## 2023-09-23 ENCOUNTER — Encounter (HOSPITAL_BASED_OUTPATIENT_CLINIC_OR_DEPARTMENT_OTHER)
Admission: RE | Disposition: A | Discharge status: Home / Self Care | Payer: Self-pay | Source: Ambulatory Visit | Attending: Obstetrics and Gynecology

## 2023-09-23 DIAGNOSIS — Z87891 Personal history of nicotine dependence: Secondary | ICD-10-CM | POA: Diagnosis not present

## 2023-09-23 DIAGNOSIS — I10 Essential (primary) hypertension: Secondary | ICD-10-CM | POA: Diagnosis not present

## 2023-09-23 DIAGNOSIS — D259 Leiomyoma of uterus, unspecified: Secondary | ICD-10-CM

## 2023-09-23 DIAGNOSIS — Z01818 Encounter for other preprocedural examination: Secondary | ICD-10-CM

## 2023-09-23 DIAGNOSIS — D251 Intramural leiomyoma of uterus: Principal | ICD-10-CM

## 2023-09-23 HISTORY — DX: Anxiety disorder, unspecified: F41.9

## 2023-09-23 HISTORY — DX: Nausea with vomiting, unspecified: Z98.890

## 2023-09-23 HISTORY — DX: Presence of spectacles and contact lenses: Z97.3

## 2023-09-23 HISTORY — DX: Vitamin D deficiency, unspecified: E55.9

## 2023-09-23 HISTORY — DX: Dizziness and giddiness: R42

## 2023-09-23 HISTORY — PX: LAPAROSCOPIC SUPRACERVICAL HYSTERECTOMY: SHX5399

## 2023-09-23 HISTORY — DX: Other specified postprocedural states: Z98.890

## 2023-09-23 HISTORY — DX: Other complications of anesthesia, initial encounter: T88.59XA

## 2023-09-23 HISTORY — PX: LAPAROSCOPIC BILATERAL SALPINGECTOMY: SHX5889

## 2023-09-23 HISTORY — DX: Attention-deficit hyperactivity disorder, unspecified type: F90.9

## 2023-09-23 HISTORY — DX: Depression, unspecified: F32.A

## 2023-09-23 HISTORY — DX: Leiomyoma of uterus, unspecified: D25.9

## 2023-09-23 LAB — CBC
HCT: 38.5 % (ref 36.0–46.0)
Hemoglobin: 12.9 g/dL (ref 12.0–15.0)
MCH: 31.9 pg (ref 26.0–34.0)
MCHC: 33.5 g/dL (ref 30.0–36.0)
MCV: 95.1 fL (ref 80.0–100.0)
Platelets: 341 10*3/uL (ref 150–400)
RBC: 4.05 MIL/uL (ref 3.87–5.11)
RDW: 12 % (ref 11.5–15.5)
WBC: 16.3 10*3/uL — ABNORMAL HIGH (ref 4.0–10.5)
nRBC: 0 % (ref 0.0–0.2)

## 2023-09-23 LAB — TYPE AND SCREEN
ABO/RH(D): A POS
Antibody Screen: NEGATIVE

## 2023-09-23 LAB — POCT PREGNANCY, URINE: Preg Test, Ur: NEGATIVE

## 2023-09-23 LAB — ABO/RH: ABO/RH(D): A POS

## 2023-09-23 SURGERY — HYSTERECTOMY, SUPRACERVICAL, LAPAROSCOPIC
Anesthesia: General | Site: Abdomen

## 2023-09-23 MED ORDER — ACETAMINOPHEN 500 MG PO TABS
ORAL_TABLET | ORAL | Status: AC
  Filled 2023-09-23: qty 2

## 2023-09-23 MED ORDER — LIDOCAINE HCL (PF) 2 % IJ SOLN
INTRAMUSCULAR | Status: AC
  Filled 2023-09-23: qty 5

## 2023-09-23 MED ORDER — BISACODYL 10 MG RE SUPP: 10.0000 mg | Freq: Every day | RECTAL | Status: DC | PRN

## 2023-09-23 MED ORDER — ACETAMINOPHEN 500 MG PO TABS
ORAL_TABLET | ORAL | Status: AC
Start: 2023-09-23 — End: ?
  Filled 2023-09-23: qty 2

## 2023-09-23 MED ORDER — GABAPENTIN 300 MG PO CAPS
300.0000 mg | ORAL_CAPSULE | ORAL | Status: AC
  Administered 2023-09-23: 300 mg via ORAL

## 2023-09-23 MED ORDER — ONDANSETRON HCL 4 MG/2ML IJ SOLN: 4.0000 mg | Freq: Four times a day (QID) | INTRAMUSCULAR | Status: DC | PRN

## 2023-09-23 MED ORDER — KETOROLAC TROMETHAMINE 30 MG/ML IJ SOLN
30.0000 mg | Freq: Four times a day (QID) | INTRAMUSCULAR | Status: AC
  Administered 2023-09-23 – 2023-09-24 (×3): 30 mg via INTRAVENOUS

## 2023-09-23 MED ORDER — OXYCODONE HCL 5 MG PO TABS
ORAL_TABLET | ORAL | Status: AC
  Filled 2023-09-23: qty 1

## 2023-09-23 MED ORDER — LIDOCAINE 2% (20 MG/ML) 5 ML SYRINGE
INTRAMUSCULAR | Status: DC | PRN
  Administered 2023-09-23: 80 mg via INTRAVENOUS

## 2023-09-23 MED ORDER — DEXTROSE-SODIUM CHLORIDE 5-0.45 % IV SOLN: INTRAVENOUS | Status: AC

## 2023-09-23 MED ORDER — FENTANYL CITRATE (PF) 100 MCG/2ML IJ SOLN
INTRAMUSCULAR | Status: DC | PRN
  Administered 2023-09-23: 50 ug via INTRAVENOUS
  Administered 2023-09-23 (×2): 25 ug via INTRAVENOUS

## 2023-09-23 MED ORDER — ACETAMINOPHEN 10 MG/ML IV SOLN
1000.0000 mg | Freq: Once | INTRAVENOUS | Status: DC | PRN
Start: 2023-09-23 — End: 2023-09-23

## 2023-09-23 MED ORDER — ALUM & MAG HYDROXIDE-SIMETH 200-200-20 MG/5ML PO SUSP: 30.0000 mL | ORAL | Status: DC | PRN

## 2023-09-23 MED ORDER — GABAPENTIN 100 MG PO CAPS
ORAL_CAPSULE | ORAL | Status: AC
  Filled 2023-09-23: qty 3

## 2023-09-23 MED ORDER — IBUPROFEN 200 MG PO TABS: 600.0000 mg | ORAL_TABLET | Freq: Four times a day (QID) | ORAL | Status: DC

## 2023-09-23 MED ORDER — GABAPENTIN 300 MG PO CAPS
ORAL_CAPSULE | ORAL | Status: AC
  Filled 2023-09-23: qty 1

## 2023-09-23 MED ORDER — OXYCODONE HCL 5 MG PO TABS
ORAL_TABLET | ORAL | Status: AC
  Filled 2023-09-23: qty 2

## 2023-09-23 MED ORDER — ONDANSETRON HCL 4 MG PO TABS: 4.0000 mg | ORAL_TABLET | Freq: Four times a day (QID) | ORAL | Status: DC | PRN

## 2023-09-23 MED ORDER — ROCURONIUM BROMIDE 10 MG/ML (PF) SYRINGE
PREFILLED_SYRINGE | INTRAVENOUS | Status: DC | PRN
  Administered 2023-09-23: 10 mg via INTRAVENOUS
  Administered 2023-09-23: 60 mg via INTRAVENOUS
  Administered 2023-09-23 (×2): 10 mg via INTRAVENOUS

## 2023-09-23 MED ORDER — ROCURONIUM BROMIDE 10 MG/ML (PF) SYRINGE
PREFILLED_SYRINGE | INTRAVENOUS | Status: AC
  Filled 2023-09-23: qty 10

## 2023-09-23 MED ORDER — ONDANSETRON HCL 4 MG/2ML IJ SOLN
INTRAMUSCULAR | Status: AC
  Filled 2023-09-23: qty 2

## 2023-09-23 MED ORDER — FENTANYL CITRATE (PF) 100 MCG/2ML IJ SOLN
INTRAMUSCULAR | Status: AC
  Filled 2023-09-23: qty 2

## 2023-09-23 MED ORDER — PROPOFOL 10 MG/ML IV BOLUS
INTRAVENOUS | Status: DC | PRN
  Administered 2023-09-23: 180 mg via INTRAVENOUS
  Administered 2023-09-23: 20 mg via INTRAVENOUS

## 2023-09-23 MED ORDER — MIDAZOLAM HCL 2 MG/2ML IJ SOLN
INTRAMUSCULAR | Status: AC
Start: 2023-09-23 — End: ?
  Filled 2023-09-23: qty 2

## 2023-09-23 MED ORDER — ACETAMINOPHEN 500 MG PO TABS
1000.0000 mg | ORAL_TABLET | ORAL | Status: AC
  Administered 2023-09-23: 1000 mg via ORAL

## 2023-09-23 MED ORDER — LACTATED RINGERS IV SOLN: INTRAVENOUS | Status: DC

## 2023-09-23 MED ORDER — OXYCODONE HCL 5 MG/5ML PO SOLN
5.0000 mg | Freq: Once | ORAL | Status: AC | PRN
Start: 2023-09-23 — End: 2023-09-23

## 2023-09-23 MED ORDER — HYDROMORPHONE HCL 1 MG/ML IJ SOLN: 1.0000 mg | INTRAMUSCULAR | Status: DC | PRN

## 2023-09-23 MED ORDER — KETOROLAC TROMETHAMINE 30 MG/ML IJ SOLN
INTRAMUSCULAR | Status: AC
  Filled 2023-09-23: qty 1

## 2023-09-23 MED ORDER — OXYCODONE HCL 5 MG PO TABS
5.0000 mg | ORAL_TABLET | ORAL | Status: DC | PRN
Start: 2023-09-23 — End: 2023-09-24
  Administered 2023-09-23 – 2023-09-24 (×4): 5 mg via ORAL

## 2023-09-23 MED ORDER — SIMETHICONE 80 MG PO CHEW
CHEWABLE_TABLET | ORAL | Status: AC
  Filled 2023-09-23: qty 1

## 2023-09-23 MED ORDER — OXYCODONE HCL 5 MG PO TABS
5.0000 mg | ORAL_TABLET | Freq: Once | ORAL | Status: AC | PRN
  Administered 2023-09-23: 5 mg via ORAL

## 2023-09-23 MED ORDER — ONDANSETRON HCL 4 MG/2ML IJ SOLN
INTRAMUSCULAR | Status: DC | PRN
  Administered 2023-09-23: 4 mg via INTRAVENOUS

## 2023-09-23 MED ORDER — SIMETHICONE 80 MG PO CHEW
80.0000 mg | CHEWABLE_TABLET | Freq: Four times a day (QID) | ORAL | Status: DC | PRN
  Administered 2023-09-23: 80 mg via ORAL

## 2023-09-23 MED ORDER — SUGAMMADEX SODIUM 200 MG/2ML IV SOLN
INTRAVENOUS | Status: DC | PRN
  Administered 2023-09-23: 200 mg via INTRAVENOUS

## 2023-09-23 MED ORDER — ACETAMINOPHEN 500 MG PO TABS
1000.0000 mg | ORAL_TABLET | Freq: Four times a day (QID) | ORAL | Status: DC
  Administered 2023-09-23 – 2023-09-24 (×4): 1000 mg via ORAL

## 2023-09-23 MED ORDER — ACETAMINOPHEN 160 MG/5ML PO SOLN: 325.0000 mg | ORAL | Status: DC | PRN

## 2023-09-23 MED ORDER — DROPERIDOL 2.5 MG/ML IJ SOLN: 0.6250 mg | Freq: Once | INTRAMUSCULAR | Status: DC | PRN

## 2023-09-23 MED ORDER — HEMOSTATIC AGENTS (NO CHARGE) OPTIME
TOPICAL | Status: DC | PRN
  Administered 2023-09-23: 1 via TOPICAL

## 2023-09-23 MED ORDER — DEXMEDETOMIDINE HCL IN NACL 80 MCG/20ML IV SOLN
INTRAVENOUS | Status: DC | PRN
  Administered 2023-09-23 (×5): 4 ug via INTRAVENOUS

## 2023-09-23 MED ORDER — DEXAMETHASONE SODIUM PHOSPHATE 10 MG/ML IJ SOLN
INTRAMUSCULAR | Status: AC
  Filled 2023-09-23: qty 1

## 2023-09-23 MED ORDER — ACETAMINOPHEN 325 MG PO TABS: 325.0000 mg | ORAL_TABLET | ORAL | Status: DC | PRN

## 2023-09-23 MED ORDER — PROPOFOL 10 MG/ML IV BOLUS
INTRAVENOUS | Status: AC
  Filled 2023-09-23: qty 20

## 2023-09-23 MED ORDER — DEXAMETHASONE SODIUM PHOSPHATE 10 MG/ML IJ SOLN
INTRAMUSCULAR | Status: DC | PRN
  Administered 2023-09-23: 10 mg via INTRAVENOUS

## 2023-09-23 MED ORDER — SCOPOLAMINE 1 MG/3DAYS TD PT72
MEDICATED_PATCH | TRANSDERMAL | Status: AC
  Filled 2023-09-23: qty 1

## 2023-09-23 MED ORDER — GABAPENTIN 300 MG PO CAPS
300.0000 mg | ORAL_CAPSULE | Freq: Three times a day (TID) | ORAL | Status: DC
  Administered 2023-09-23 (×2): 300 mg via ORAL

## 2023-09-23 MED ORDER — SODIUM CHLORIDE 0.9 % IR SOLN
Status: DC | PRN
  Administered 2023-09-23: 1000 mL

## 2023-09-23 MED ORDER — MIDAZOLAM HCL 5 MG/5ML IJ SOLN
INTRAMUSCULAR | Status: DC | PRN
  Administered 2023-09-23: 2 mg via INTRAVENOUS

## 2023-09-23 MED ORDER — KETOROLAC TROMETHAMINE 30 MG/ML IJ SOLN
INTRAMUSCULAR | Status: DC | PRN
  Administered 2023-09-23: 30 mg via INTRAVENOUS

## 2023-09-23 MED ORDER — FENTANYL CITRATE (PF) 100 MCG/2ML IJ SOLN
25.0000 ug | INTRAMUSCULAR | Status: DC | PRN
  Administered 2023-09-23: 25 ug via INTRAVENOUS

## 2023-09-23 MED ORDER — CEFAZOLIN SODIUM-DEXTROSE 2-4 GM/100ML-% IV SOLN
INTRAVENOUS | Status: AC
  Filled 2023-09-23: qty 100

## 2023-09-23 MED ORDER — SCOPOLAMINE 1 MG/3DAYS TD PT72
1.0000 | MEDICATED_PATCH | TRANSDERMAL | Status: DC
  Administered 2023-09-23: 1.5 mg via TRANSDERMAL

## 2023-09-23 MED ORDER — BUPIVACAINE HCL (PF) 0.25 % IJ SOLN
INTRAMUSCULAR | Status: DC | PRN
  Administered 2023-09-23: 18 mL

## 2023-09-23 MED ORDER — BUPIVACAINE HCL (PF) 0.25 % IJ SOLN
INTRAMUSCULAR | Status: AC
  Filled 2023-09-23: qty 30

## 2023-09-23 MED ORDER — MENTHOL 3 MG MT LOZG: 1.0000 | LOZENGE | OROMUCOSAL | Status: DC | PRN

## 2023-09-23 MED ORDER — CEFAZOLIN SODIUM-DEXTROSE 2-4 GM/100ML-% IV SOLN
2.0000 g | INTRAVENOUS | Status: AC
Start: 2023-09-23 — End: 2023-09-23
  Administered 2023-09-23: 2 g via INTRAVENOUS

## 2023-09-23 SURGICAL SUPPLY — 55 items
APPLICATOR COTTON TIP 6 STRL (MISCELLANEOUS) IMPLANT
APPLICATOR COTTON TIP 6IN STRL (MISCELLANEOUS) ×2
BARRIER ADHS 3X4 INTERCEED (GAUZE/BANDAGES/DRESSINGS) IMPLANT
BLADE SURG 10 STRL SS (BLADE) IMPLANT
CABLE HIGH FREQUENCY MONO STRZ (ELECTRODE) IMPLANT
CATH ROBINSON RED A/P 16FR (CATHETERS) ×2 IMPLANT
CELL SAVER LIPIGURD (MISCELLANEOUS) ×2 IMPLANT
COVER MAYO STAND STRL (DRAPES) ×2 IMPLANT
DERMABOND ADVANCED .7 DNX12 (GAUZE/BANDAGES/DRESSINGS) ×2 IMPLANT
DEVICE RETRIEVAL ALEXIS 14 (MISCELLANEOUS) IMPLANT
DRAPE SURG IRRIG POUCH 19X23 (DRAPES) ×2 IMPLANT
DRSG OPSITE POSTOP 3X4 (GAUZE/BANDAGES/DRESSINGS) IMPLANT
DURAPREP 26ML APPLICATOR (WOUND CARE) ×2 IMPLANT
EXTRT SYSTEM ALEXIS 14CM (MISCELLANEOUS) ×2
GAUZE 4X4 16PLY ~~LOC~~+RFID DBL (SPONGE) ×2 IMPLANT
GLOVE BIOGEL PI IND STRL 7.0 (GLOVE) IMPLANT
GLOVE BIOGEL PI IND STRL 8 (GLOVE) ×2 IMPLANT
GLOVE ORTHO TXT STRL SZ7.5 (GLOVE) ×2 IMPLANT
GLOVE SURG SS PI 7.0 STRL IVOR (GLOVE) IMPLANT
GOWN STRL REUS W/TWL LRG LVL3 (GOWN DISPOSABLE) IMPLANT
GOWN STRL REUS W/TWL XL LVL3 (GOWN DISPOSABLE) ×2 IMPLANT
IRRIG SUCT STRYKERFLOW 2 WTIP (MISCELLANEOUS) ×2
IRRIGATION SUCT STRKRFLW 2 WTP (MISCELLANEOUS) ×2 IMPLANT
KIT PINK PAD W/HEAD ARE REST (MISCELLANEOUS) ×2
KIT PINK PAD W/HEAD ARM REST (MISCELLANEOUS) ×2 IMPLANT
KIT TURNOVER CYSTO (KITS) ×2 IMPLANT
NDL INSUFFLATION 14GA 120MM (NEEDLE) ×2 IMPLANT
NDL SPNL 22GX3.5 QUINCKE BK (NEEDLE) IMPLANT
NEEDLE INSUFFLATION 14GA 120MM (NEEDLE) ×2 IMPLANT
NEEDLE SPNL 22GX3.5 QUINCKE BK (NEEDLE) ×2 IMPLANT
NS IRRIG 1000ML POUR BTL (IV SOLUTION) ×2 IMPLANT
PACK LAPAROSCOPY BASIN (CUSTOM PROCEDURE TRAY) ×2 IMPLANT
PROTECTOR NERVE ULNAR (MISCELLANEOUS) ×4 IMPLANT
SET TUBE SMOKE EVAC HIGH FLOW (TUBING) ×2 IMPLANT
SHEARS HARMONIC 36 ACE (MISCELLANEOUS) IMPLANT
SLEEVE SCD COMPRESS KNEE MED (STOCKING) ×2 IMPLANT
SLEEVE Z-THREAD 5X100MM (TROCAR) ×2 IMPLANT
SOL ELECTROSURG ANTI STICK (MISCELLANEOUS)
SOLUTION ELECTROSURG ANTI STCK (MISCELLANEOUS) IMPLANT
SPONGE T-LAP 18X18 ~~LOC~~+RFID (SPONGE) IMPLANT
SUT VIC AB 3-0 PS2 18 (SUTURE) ×2
SUT VIC AB 3-0 PS2 18XBRD (SUTURE) ×2 IMPLANT
SUT VIC AB 4-0 PS2 18 (SUTURE) ×2 IMPLANT
SUT VICRYL 0 UR6 27IN ABS (SUTURE) ×2 IMPLANT
SUT VLOC 180 0 9IN GS21 (SUTURE) IMPLANT
SYR 20ML LL LF (SYRINGE) IMPLANT
SYR CONTROL 10ML LL (SYRINGE) IMPLANT
SYS BAG RETRIEVAL 10MM (BASKET)
SYSTEM BAG RETRIEVAL 10MM (BASKET) IMPLANT
TOWEL OR 17X24 6PK STRL BLUE (TOWEL DISPOSABLE) ×2 IMPLANT
TRAY FOLEY W/BAG SLVR 14FR LF (SET/KITS/TRAYS/PACK) ×2 IMPLANT
TROCAR BALLN 12MMX100 BLUNT (TROCAR) ×2 IMPLANT
TROCAR Z-THREAD FIOS 11X100 BL (TROCAR) IMPLANT
TROCAR Z-THREAD FIOS 5X100MM (TROCAR) ×2 IMPLANT
WARMER LAPAROSCOPE (MISCELLANEOUS) ×2 IMPLANT

## 2023-09-23 NOTE — Op Note (Signed)
Preoperative diagnosis: Symptomatic myomatous uterus Postoperative diagnosis: Same Procedure: Laparoscopic supracervical hysterectomy, bilateral salpingectomy Surgeon: Lavina Hamman M.D. Assistant: Truett Perna, D.O. Anesthesia: Gen. Endotracheal tube Findings: She had an enlarged uterus with multiple myomas, normal tubes and ovaries Specimens: Morcellated uterus for routine pathology Estimated blood loss: 400 cc Complications: None   Procedure in detail: The patient was taken to the operating room and placed in the dorsosupine position. General anesthesia was induced. Arms were tucked to her sides and legs were placed in mobile stirrups. Abdomen perineum and vagina were then prepped and draped in usual sterile fashion and a Foley catheter was inserted. Infraumbilical skin was infiltrated with quarter percent Marcaine and a 3 cm horizontal incision was made.  The fascia was elevated and entered sharply with scissors.  Peritoneum was then entered bluntly.  A pursestring suture of 0 Vicryl was then placed around the fascial incision.  The Hassan cannula with a balloon was then inserted and the abdomen was insufflated.  The laparoscope was inserted and confirmed good placement.  A 5 mm port was then placed on each side under direct visualization. Inspection revealed the above-mentioned findings.  The distal right fallopian tube was grasped and elevated.  Harmonic scalpel was used to take down the mesosalpinx to the uterus.  The right uterine cornu was grasped with a single-tooth tenaculum from the left side. The Harmonic scalpel Ace was used to take down the right round ligament, utero-ovarian pedicle and broad ligament. The anterior peritoneum was incised across the anterior portion of the uterus to help release the bladder. Uterine artery artery was skeletonized and taken down with the harmonic scalpel Ace with adequate division and adequate hemostasis. A similar procedure was then performed on the  patient's left side taking down the mesosalpinx to free the tube, round ligament, utero-ovarian pedicle, and broad ligament. Anterior peritoneum was incised across the anterior portion the uterus to meet the incision coming from the patient's right side. Uterine artery was skeletonized and taken down with the Harmonic Scalpel with adequate division and adequate hemostasis. I then began to remove the uterus from the cervix using a drill, clamp, cut technique on maximum power, using the Harmonic scalpel Ace. This was done about halfway on the left side and then halfway on the right side removing the uterus from the cervix. The cervical stump appeared to be hemostatic.  The umbilical trocar was removed and an Alexis bag was inserted through the incision into the pelvis.  The umbilical trocar was reintroduced and the abdomen was reinsufflated.  I was then able to position the bag in the pelvis and place- the uterus in the bag-this required 2 separate insertions of the bag.  The edge of the bag was grasped after the umbilical trocar was removed.  The bag was then brought through the incision so that we could morcellate the uterus. The guard was placed in the bag to protect the abdominal incision.   I then used a scalpel and was able to remove the uterus in several pieces in the bag.  The bag was removed and the umbilical trocar was replaced.  Pelvis was copiously irrigated. Small amount of bleeding from the cervical stump was controlled with the harmonic scalpel Ace. A piece of Interceed was placed over the cervical stump. At this point all pedicles appeared to be hemostatic and there was no other pathology noted.  The 5 mm ports were removed under direct visualization all gas was allowed to deflate from the abdomen.  The  umbilical trocar was removed.  The previously placed pursestring suture was tied and this achieved good fascial closure.  An extra figure 8 of 0 Vicryl was placed to support the fascia. Skin incisions  were closed with interrupted subcuticular sutures of 4-0 Vicryl followed by Dermabond. The patient tolerated the procedure well. She was taken to the recovery in stable condition. Counts were correct x2, she received Ancef 2 g IV the beginning of the procedure and had PAS hose on throughout the procedure.

## 2023-09-23 NOTE — Anesthesia Procedure Notes (Signed)
Procedure Name: Intubation Date/Time: 09/23/2023 9:47 AM  Performed by: Deiondra Denley D, CRNAPre-anesthesia Checklist: Patient identified, Emergency Drugs available, Suction available and Patient being monitored Patient Re-evaluated:Patient Re-evaluated prior to induction Oxygen Delivery Method: Circle system utilized Preoxygenation: Pre-oxygenation with 100% oxygen Induction Type: IV induction Ventilation: Mask ventilation without difficulty Laryngoscope Size: Mac and 3 Grade View: Grade I Tube type: Oral Tube size: 7.0 mm Number of attempts: 1 Airway Equipment and Method: Stylet and Oral airway Placement Confirmation: ETT inserted through vocal cords under direct vision, positive ETCO2 and breath sounds checked- equal and bilateral Secured at: 21 cm Tube secured with: Tape Dental Injury: Teeth and Oropharynx as per pre-operative assessment

## 2023-09-23 NOTE — Transfer of Care (Signed)
Immediate Anesthesia Transfer of Care Note  Patient: Donna Wong  Procedure(s) Performed: LAPAROSCOPIC SUPRACERVICAL HYSTERECTOMY (Abdomen) LAPAROSCOPIC BILATERAL SALPINGECTOMY (Bilateral: Abdomen)  Patient Location: PACU  Anesthesia Type:General  Level of Consciousness: awake, alert , and oriented  Airway & Oxygen Therapy: Patient Spontanous Breathing and Patient connected to nasal cannula oxygen  Post-op Assessment: Report given to RN and Post -op Vital signs reviewed and stable  Post vital signs: Reviewed and stable  Last Vitals:  Vitals Value Taken Time  BP 109/64 09/23/23 1217  Temp    Pulse 66 09/23/23 1222  Resp 18 09/23/23 1222  SpO2 98 % 09/23/23 1222  Vitals shown include unfiled device data.  Last Pain:  Vitals:   09/23/23 0757  TempSrc: Oral  PainSc: 0-No pain      Patients Stated Pain Goal: 4 (09/23/23 0757)  Complications: No notable events documented.

## 2023-09-23 NOTE — Anesthesia Preprocedure Evaluation (Addendum)
Anesthesia Evaluation  Patient identified by MRN, date of birth, ID band Patient awake    Reviewed: Allergy & Precautions, NPO status , Patient's Chart, lab work & pertinent test results  History of Anesthesia Complications (+) PONV and history of anesthetic complications  Airway Mallampati: I  TM Distance: >3 FB Neck ROM: Full    Dental  (+) Teeth Intact, Dental Advisory Given   Pulmonary former smoker   breath sounds clear to auscultation       Cardiovascular hypertension,  Rhythm:Regular Rate:Normal     Neuro/Psych  PSYCHIATRIC DISORDERS Anxiety Depression       GI/Hepatic negative GI ROS, Neg liver ROS,,,  Endo/Other  negative endocrine ROS    Renal/GU negative Renal ROS     Musculoskeletal negative musculoskeletal ROS (+)    Abdominal   Peds  Hematology negative hematology ROS (+)   Anesthesia Other Findings   Reproductive/Obstetrics                             Anesthesia Physical Anesthesia Plan  ASA: 2  Anesthesia Plan: General   Post-op Pain Management: Tylenol PO (pre-op)* and Toradol IV (intra-op)*   Induction: Intravenous  PONV Risk Score and Plan: 4 or greater and Ondansetron, Dexamethasone, Midazolam and Scopolamine patch - Pre-op  Airway Management Planned: Oral ETT  Additional Equipment: None  Intra-op Plan:   Post-operative Plan: Extubation in OR  Informed Consent: I have reviewed the patients History and Physical, chart, labs and discussed the procedure including the risks, benefits and alternatives for the proposed anesthesia with the patient or authorized representative who has indicated his/her understanding and acceptance.     Dental advisory given  Plan Discussed with: CRNA  Anesthesia Plan Comments:        Anesthesia Quick Evaluation

## 2023-09-23 NOTE — Anesthesia Postprocedure Evaluation (Addendum)
Anesthesia Post Note  Patient: Zareah Kleinsasser  Procedure(s) Performed: LAPAROSCOPIC SUPRACERVICAL HYSTERECTOMY (Abdomen) LAPAROSCOPIC BILATERAL SALPINGECTOMY (Bilateral: Abdomen)     Patient location during evaluation: PACU Anesthesia Type: General Level of consciousness: awake and alert Pain management: pain level controlled Vital Signs Assessment: post-procedure vital signs reviewed and stable Respiratory status: spontaneous breathing, nonlabored ventilation, respiratory function stable and patient connected to nasal cannula oxygen Cardiovascular status: blood pressure returned to baseline and stable Postop Assessment: no apparent nausea or vomiting Anesthetic complications: no  No notable events documented.  Last Vitals:  Vitals:   09/23/23 1324 09/23/23 1340  BP: (!) 98/57 102/64  Pulse: 61 69  Resp: 16 15  Temp: (!) 36.3 C 36.9 C  SpO2: 94% 97%                 Shelton Silvas

## 2023-09-23 NOTE — Progress Notes (Signed)
Attempted to ambulate pt, upon standing pt became nauseated and dizzy, had pallor. RN x 2 assisted pt back to supine position in bed, VSS, abd soft and non-tender, abdominal incisions CDI, peripad CDI. Will continue to monitor, husband at bedside.   Frances Furbish, RN

## 2023-09-23 NOTE — Progress Notes (Signed)
Post-op check, s/p LSH  Pain ok, light headed and nauseated when tried to ambulate, tol liquids, adequate clear urine Afeb, VSS Abd- soft, incisions intact, mild/appropriate tenderness  Overall doing well, but unable to ambulate yet.  Will advance diet, check CBC, remove foley when able to ambulate, plan to keep overnight

## 2023-09-24 DIAGNOSIS — D259 Leiomyoma of uterus, unspecified: Secondary | ICD-10-CM | POA: Diagnosis not present

## 2023-09-24 MED ORDER — ACETAMINOPHEN 500 MG PO TABS
ORAL_TABLET | ORAL | Status: AC
Start: 2023-09-24 — End: ?
  Filled 2023-09-24: qty 2

## 2023-09-24 MED ORDER — GABAPENTIN 300 MG PO CAPS: 300.0000 mg | ORAL_CAPSULE | Freq: Three times a day (TID) | ORAL | 0 refills | Status: AC

## 2023-09-24 MED ORDER — ACETAMINOPHEN 500 MG PO TABS
ORAL_TABLET | ORAL | Status: AC
  Filled 2023-09-24: qty 2

## 2023-09-24 MED ORDER — IBUPROFEN 600 MG PO TABS: 600.0000 mg | ORAL_TABLET | Freq: Four times a day (QID) | ORAL | 0 refills | Status: AC

## 2023-09-24 MED ORDER — OXYCODONE HCL 5 MG PO TABS: 5.0000 mg | ORAL_TABLET | ORAL | 0 refills | Status: AC | PRN

## 2023-09-24 MED ORDER — OXYCODONE HCL 5 MG PO TABS
ORAL_TABLET | ORAL | Status: AC
  Filled 2023-09-24: qty 1

## 2023-09-24 MED ORDER — KETOROLAC TROMETHAMINE 30 MG/ML IJ SOLN
INTRAMUSCULAR | Status: AC
  Filled 2023-09-24: qty 1

## 2023-09-24 NOTE — Discharge Instructions (Signed)
Routine instructions for laparoscopic hysterectomy 

## 2023-09-24 NOTE — Discharge Summary (Addendum)
Physician Discharge Summary  Patient ID: Donna Wong MRN: [id number] DOB/AGE: 45-15-1977 45 y.o.  Admit date: 09/23/2023 Discharge date: 09/24/2023  Admission Diagnoses:  Symptomatic myomatous uterus  Discharge Diagnoses: Same Principal Problem:   Uterine leiomyoma   Discharged Condition: good  Hospital Course: Admitted and underwent LSH, bilateral salpingectomy without complications, EBL 400cc.  Post-op she did well except for some initial difficulty ambulating.  By POD #1 she is meeting all milestones and stable for discharge   Discharge Exam: Blood pressure 106/63, pulse 78, temperature 99 F (37.2 C), resp. rate 20, height 5\' 1"  (1.549 m), weight 84.4 kg, last menstrual period 09/07/2023, SpO2 98%. General appearance: alert  Disposition: Discharge disposition: 01-Home or Self Care       Discharge Instructions     Call MD for:  persistant nausea and vomiting   Complete by: As directed    Call MD for:  severe uncontrolled pain   Complete by: As directed    Call MD for:  temperature >100.4   Complete by: As directed    Diet - low sodium heart healthy   Complete by: As directed    Increase activity slowly   Complete by: As directed    Lifting restrictions   Complete by: As directed    10 lbs      Allergies as of 09/24/2023   No Known Allergies      Medication List     TAKE these medications    Adzenys XR-ODT 12.5 MG Tbed Generic drug: Amphetamine ER Take by mouth.   escitalopram 10 MG tablet Commonly known as: LEXAPRO Take 10 mg by mouth daily.   gabapentin 300 MG capsule Commonly known as: NEURONTIN Take 1 capsule (300 mg total) by mouth 3 (three) times daily for 2 days.   ibuprofen 600 MG tablet Commonly known as: ADVIL Take 1 tablet (600 mg total) by mouth every 6 (six) hours. What changed:  medication strength how much to take when to take this reasons to take this   oxyCODONE 5 MG immediate release tablet Commonly known as: Oxy  IR/ROXICODONE Take 1 tablet (5 mg total) by mouth every 4 (four) hours as needed for severe pain (pain score 7-10).   Wegovy 0.25 MG/0.5ML Soaj Generic drug: Semaglutide-Weight Management Inject 0.25 mg into the skin once a week.        Follow-up Information     Kiet Geer, MD. Schedule an appointment as soon as possible for a visit in 2 week(s).   Specialty: Obstetrics and Gynecology Contact information: [address], [address] [place] [address] [phone]                 Signed: Leighton Roach Lyric Rossano 09/24/2023, 7:24 AM

## 2023-09-24 NOTE — Progress Notes (Signed)
1 Day Post-Op Procedure(s) (LRB): LAPAROSCOPIC SUPRACERVICAL HYSTERECTOMY (N/A) LAPAROSCOPIC BILATERAL SALPINGECTOMY (Bilateral)  Subjective: Patient reports incisional pain, tolerating PO, + flatus, and no problems voiding.    Objective: I have reviewed patient's vital signs, intake and output, medications, and labs.  General: alert GI: soft, mild/appropriate tenderness, incisions intact  Hgb 14.5 to 12.9 last pm  Assessment: s/p Procedure(s): LAPAROSCOPIC SUPRACERVICAL HYSTERECTOMY (N/A) LAPAROSCOPIC BILATERAL SALPINGECTOMY (Bilateral): stable and progressing well  Plan: Discharge home  LOS: 0 days    Zenaida Niece, MD 09/24/2023, 7:18 AM

## 2023-09-27 ENCOUNTER — Encounter (HOSPITAL_BASED_OUTPATIENT_CLINIC_OR_DEPARTMENT_OTHER): Payer: Self-pay | Admitting: Obstetrics and Gynecology

## 2023-09-27 LAB — SURGICAL PATHOLOGY

## 2023-10-12 ENCOUNTER — Other Ambulatory Visit: Payer: Self-pay

## 2023-10-12 MED ORDER — WEGOVY 0.5 MG/0.5ML ~~LOC~~ SOAJ
0.5000 mg | SUBCUTANEOUS | 0 refills | Status: DC
  Filled 2023-10-12: qty 2, 28d supply, fill #0

## 2023-10-18 ENCOUNTER — Other Ambulatory Visit: Payer: Self-pay

## 2023-10-21 ENCOUNTER — Other Ambulatory Visit: Payer: Self-pay

## 2023-11-16 ENCOUNTER — Other Ambulatory Visit: Payer: Self-pay

## 2023-11-16 MED ORDER — WEGOVY 0.5 MG/0.5ML ~~LOC~~ SOAJ
0.5000 mg | SUBCUTANEOUS | 0 refills | Status: DC
  Filled 2023-11-16: qty 2, 28d supply, fill #0

## 2023-11-30 ENCOUNTER — Other Ambulatory Visit: Payer: Self-pay

## 2023-11-30 MED ORDER — WEGOVY 0.5 MG/0.5ML ~~LOC~~ SOAJ
0.5000 mg | SUBCUTANEOUS | 3 refills | Status: AC
  Filled 2023-11-30: qty 2, 28d supply, fill #0

## 2024-11-04 ENCOUNTER — Ambulatory Visit: Admission: EM | Admit: 2024-11-04 | Discharge: 2024-11-04 | Disposition: A | Discharge status: Home / Self Care

## 2024-11-04 ENCOUNTER — Encounter: Payer: Self-pay | Admitting: Emergency Medicine

## 2024-11-04 DIAGNOSIS — J069 Acute upper respiratory infection, unspecified: Secondary | ICD-10-CM | POA: Diagnosis not present

## 2024-11-04 DIAGNOSIS — J0140 Acute pansinusitis, unspecified: Secondary | ICD-10-CM | POA: Diagnosis not present

## 2024-11-04 LAB — POCT INFLUENZA A/B
Influenza A, POC: NEGATIVE
Influenza B, POC: NEGATIVE

## 2024-11-04 MED ORDER — AMOXICILLIN-POT CLAVULANATE 875-125 MG PO TABS: 1.0000 | ORAL_TABLET | Freq: Two times a day (BID) | ORAL | 0 refills | Status: AC

## 2024-11-04 NOTE — ED Triage Notes (Signed)
 Pt presents c/o URI x 3 days. Pt states,  I have all this facial pressure like all of my sinus canals are blocked. I have ear pain in both ears. A dry cough and green thick mucus. My nose has been running so bad. OMG.   Pt denies emesis and diarrhea.

## 2024-11-04 NOTE — ED Provider Notes (Signed)
 " [facility]    CSN: [id number] Arrival date & time: 11/04/24  1122      History   Chief Complaint Chief Complaint  Patient presents with   Facial Pain    Sinus pressure    Nasal Congestion   Otalgia    Bilateral     HPI Donna Wong is a 48 y.o. female.   Pt presents today due to 3 days of sinus pressure, sinus pain, headache, neck pain, bilateral ear pain and fullness, and purulent nasal drainage. Pt states that she has been using OTC cold meds with no significant relief of symptoms. Pt denies known sick contacts.   The history is provided by the patient.  Otalgia   Past Medical History:  Diagnosis Date   ADHD (attention deficit hyperactivity disorder)    Follows w/ Oneil Pho, PA at [facility].   Anxiety    Complication of anesthesia    Constipation    Depression    Follows w/ Dr. Verena, PCP.   High blood pressure    Hx of HTN, no meds, Bp has been normal. Follows w/ Dr. Verena.   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   PONV (postoperative nausea and vomiting)    Vertigo    no episodes recently   Vitamin D  deficiency    Wears contact lenses    Wears glasses     Patient Active Problem List   Diagnosis Date Noted   Uterine leiomyoma 09/23/2023    Past Surgical History:  Procedure Laterality Date   DILATION AND CURETTAGE OF UTERUS  2012   LAPAROSCOPIC BILATERAL SALPINGECTOMY Bilateral 09/23/2023   Procedure: LAPAROSCOPIC BILATERAL SALPINGECTOMY;  Surgeon: Horacio Boas, MD;  Location: [facility];  Service: Gynecology;  Laterality: Bilateral;   LAPAROSCOPIC SUPRACERVICAL HYSTERECTOMY N/A 09/23/2023   Procedure: LAPAROSCOPIC SUPRACERVICAL HYSTERECTOMY;  Surgeon: Horacio Boas, MD;  Location: [facility];  Service: Gynecology;  Laterality: N/A;    OB History     Gravida  1   Para  0   Term       Preterm      AB  1   Living  0      SAB  1   IAB      Ectopic      Multiple      Live Births               Home Medications    Prior to Admission medications  Medication Sig Start Date End Date Taking? Authorizing Provider  alprazolam (XANAX) 2 MG tablet  11/04/22  Yes [provider]  fluconazole (DIFLUCAN) 150 MG tablet TAKE 1 TABLET BY MOUTH EVERY 72 HOURS AS DIRECTED FOR 6 DAYS 08/10/23  Yes [provider]  albuterol (VENTOLIN HFA) 108 (90 Base) MCG/ACT inhaler TAKE 1 PUFF(S) INHALED 4 TIMES A DAY, AS NEEDED    [provider]  Amphetamine ER (ADZENYS XR-ODT) 12.5 MG TBED Take by mouth.    [provider]  escitalopram (LEXAPRO) 10 MG tablet Take 10 mg by mouth daily.    [provider]  gabapentin  (NEURONTIN ) 300 MG capsule Take 1 capsule (300 mg total) by mouth 3 (three) times daily for 2 days. 09/24/23 09/26/23  Meisinger, Boas, MD  ibuprofen  (ADVIL ) 600 MG tablet Take 1 tablet (600 mg total) by mouth every 6 (six) hours.  09/24/23   Meisinger, Krystal, MD  oxyCODONE  (OXY IR/ROXICODONE ) 5 MG immediate release tablet Take 1 tablet (5 mg total) by mouth every 4 (four) hours as needed for severe pain (pain score 7-10). 09/24/23   Meisinger, Krystal, MD  Semaglutide -Weight Management (WEGOVY ) 0.25 MG/0.5ML SOAJ Inject 0.25 mg into the skin once a week. 09/01/23     Semaglutide -Weight Management (WEGOVY ) 0.5 MG/0.5ML SOAJ Inject 0.5 mg into the skin once a week. 11/30/23       Family History Family History  Problem Relation Age of Onset   Heart attack Father    Heart disease Father     Social History Social History[1]   Allergies   Patient has no known allergies.   Review of Systems Review of Systems  HENT:  Positive for ear pain.      Physical Exam Triage Vital Signs ED Triage Vitals  Encounter Vitals Group     BP 11/04/24 1317 134/85     Girls Systolic BP Percentile --      Girls Diastolic BP Percentile --       Boys Systolic BP Percentile --      Boys Diastolic BP Percentile --      Pulse Rate 11/04/24 1317 99     Resp 11/04/24 1317 18     Temp 11/04/24 1317 99 F (37.2 C)     Temp Source 11/04/24 1317 Oral     SpO2 11/04/24 1317 97 %     Weight 11/04/24 1316 192 lb 7.4 oz (87.3 kg)     Height --      Head Circumference --      Peak Flow --      Pain Score 11/04/24 1315 3     Pain Loc --      Pain Education --      Exclude from Growth Chart --    No data found.  Updated Vital Signs BP 134/85 (BP Location: Left Arm)   Pulse 99   Temp 99 F (37.2 C) (Oral)   Resp 18   Wt 192 lb 7.4 oz (87.3 kg)   LMP  (LMP Unknown)   SpO2 97%   BMI 36.37 kg/m   Visual Acuity Right Eye Distance:   Left Eye Distance:   Bilateral Distance:    Right Eye Near:   Left Eye Near:    Bilateral Near:     Physical Exam Vitals and nursing note reviewed.  Constitutional:      General: She is not in acute distress.    Appearance: Normal appearance. She is not ill-appearing, toxic-appearing or diaphoretic.  HENT:     Right Ear: Tympanic membrane, ear canal and external ear normal.     Left Ear: Tympanic membrane, ear canal and external ear normal.     Nose: Congestion (moderately enlarged turbinates) present. No rhinorrhea.     Mouth/Throat:     Mouth: Mucous membranes are moist.     Pharynx: Oropharynx is clear. No oropharyngeal exudate or posterior oropharyngeal erythema.  Eyes:     General: No scleral icterus. Cardiovascular:     Rate and Rhythm: Normal rate and regular rhythm.     Heart sounds: Normal heart sounds.  Pulmonary:     Effort: Pulmonary effort is normal. No respiratory distress.     Breath sounds: Normal breath sounds. No wheezing or rhonchi.  Musculoskeletal:     Cervical back: Tenderness present.  Lymphadenopathy:     Cervical: No cervical adenopathy.  Skin:  General: Skin is warm.  Neurological:     Mental Status: She is alert and oriented to person, place, and  time.  Psychiatric:        Mood and Affect: Mood normal.        Behavior: Behavior normal.      UC Treatments / Results  Labs (all labs ordered are listed, but only abnormal results are displayed) Labs Reviewed  POCT INFLUENZA A/B    EKG   Radiology No results found.  Procedures Procedures (including critical care time)  Medications Ordered in UC Medications - No data to display  Initial Impression / Assessment and Plan / UC Course  I have reviewed the triage vital signs and the nursing notes.  Pertinent labs & imaging results that were available during my care of the patient were reviewed by me and considered in my medical decision making (see chart for details).     Final Clinical Impressions(s) / UC Diagnoses   Final diagnoses:  Viral URI   Discharge Instructions   None    ED Prescriptions   None    PDMP not reviewed this encounter.    [1]  Social History Tobacco Use   Smoking status: Former    Current packs/day: 0.00    Types: Cigarettes    Quit date: 07/26/2010    Years since quitting: 14.2    Passive exposure: Past   Smokeless tobacco: Never  Vaping Use   Vaping status: Never Used  Substance Use Topics   Alcohol use: Yes    Alcohol/week: 3.0 standard drinks of alcohol    Types: 3 Glasses of wine per week    Comment: 2 or 3 glasses   Drug use: No     Andra Corean BROCKS, PA-C 11/04/24 1346  "

## 2024-11-04 NOTE — Discharge Instructions (Addendum)
 NEGATIVE FOR FLU  You have been diagnosed with a sinus infection today, some are caused by viruses and others are caused by bacteria.  If your symptoms have been going on for less than 7 days it is most likely that you have a viral sinus infection.  Antibiotics will not work for this and it will have to run its course.  Sinus rinses (using a Nettie pot) or saline rinses are helpful as well as pseudoephedrine, and nasal sprays along with ibuprofen  and Tylenol  for pain.  If you have had your symptoms for more than 7 days you most likely have a bacterial infection and will be prescribed antibiotics.  Supportive measures given for viral sinus infections will also be helpful for bacterial infections.  If you are using antibiotics you should start to feel better in 2 to 3 days but it is important that you complete antibiotics in their entirety.
